# Patient Record
Sex: Female | Born: 1990 | Race: Black or African American | Hispanic: No | Smoking: Never smoker
Health system: Southern US, Community
[De-identification: ages and names within clinical notes are randomized; demographics above are authoritative.]

## PROBLEM LIST (undated history)

## (undated) DIAGNOSIS — J45909 Unspecified asthma, uncomplicated: Secondary | ICD-10-CM

## (undated) HISTORY — PX: TONSILLECTOMY: SUR1361

## (undated) HISTORY — PX: CYSTECTOMY: SUR359

---
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
## 2010-05-23 NOTE — Op Note (Signed)
NAME:  JURY, CASERTA NO.:  [id number]   MEDICAL RECORD NO.:  [id number]          PATIENT TYPE:  AMB   LOCATION:  DSC                          FACILITY:  [facility]   PHYSICIAN:  Bunnie Pion, MD   DATE OF BIRTH:  31-May-1990   DATE OF PROCEDURE:  03/15/2007  DATE OF DISCHARGE:  03/10/2007                               OPERATIVE REPORT   PREOPERATIVE DIAGNOSIS:  Pilonidal sinus.   POSTOPERATIVE DIAGNOSIS:  Pilonidal sinus.   OPERATION PERFORMED:  Incision drainage of pilonidal sinus.   SURGEON:  Cyd Silence, M.D.   DESCRIPTION OF PROCEDURE:  After identifying the patient, she was placed  in supine position upon the operating room table.  When adequate level  of anesthesia had been safely obtained, she was rolled to a prone  position and carefully padded and secured by the anesthesia team and  myself.  The buttocks were taped opened.  Examination of the gluteal  cleft superiorly did not demonstrate any evidence of an abscess.  The  site was prepped and draped.  A small punctum was examined and was  probed.  This entered into a small sinus cavity approximately the size  of a jelly bean.  There was no evidence of purulence within this.  A  small elliptical incision was made to open this up.  The cavity was  debrided with blunt instruments and electrocautery.  The site was packed  with iodoform gauze.  Dressings were applied.  The patient was awakened  in the operating room and returned to the recovery room in stable  condition.      Bunnie Pion, MD  Electronically Signed     TMW/MEDQ  D:  03/15/2007  T:  03/17/2007  Job:  [id number]

## 2010-10-02 LAB — POCT HEMOGLOBIN-HEMACUE: Hemoglobin: 11.7 — ABNORMAL LOW

## 2010-10-05 LAB — URINALYSIS, ROUTINE W REFLEX MICROSCOPIC
Bilirubin Urine: NEGATIVE
Glucose, UA: NEGATIVE
Ketones, ur: 15 — AB
Nitrite: NEGATIVE
Protein, ur: 30 — AB
Specific Gravity, Urine: 1.028
Urobilinogen, UA: 1
pH: 6

## 2010-10-05 LAB — CBC
HCT: 35.8 — ABNORMAL LOW
Hemoglobin: 11.6 — ABNORMAL LOW
MCHC: 32.5
MCV: 82
Platelets: 347
RBC: 4.36
RDW: 15.8 — ABNORMAL HIGH
WBC: 10.3

## 2010-10-05 LAB — URINE MICROSCOPIC-ADD ON

## 2010-10-05 LAB — URINE CULTURE: Colony Count: >=100000

## 2010-10-05 LAB — DIFFERENTIAL
Basophils Absolute: 0
Basophils Relative: 0
Eosinophils Absolute: 0.2
Eosinophils Relative: 2
Lymphocytes Relative: 19
Lymphs Abs: 1.9
Monocytes Absolute: 0.8
Monocytes Relative: 7
Neutro Abs: 7.4
Neutrophils Relative %: 72

## 2010-10-05 LAB — CULTURE, ROUTINE-ABSCESS: Gram Stain: NONE SEEN

## 2010-10-05 LAB — POCT PREGNANCY, URINE
Operator id: 277751
Preg Test, Ur: NEGATIVE

## 2010-10-16 LAB — URINALYSIS, ROUTINE W REFLEX MICROSCOPIC
Bilirubin Urine: NEGATIVE
Glucose, UA: NEGATIVE
Hgb urine dipstick: NEGATIVE
Ketones, ur: NEGATIVE
Nitrite: NEGATIVE
Protein, ur: NEGATIVE
Specific Gravity, Urine: 1.029
Urobilinogen, UA: 1
pH: 6.5

## 2010-10-16 LAB — COMPREHENSIVE METABOLIC PANEL
ALT: 25
AST: 55 — ABNORMAL HIGH
Albumin: 4
Alkaline Phosphatase: 44 — ABNORMAL LOW
BUN: 13
CO2: 24
Calcium: 8.8
Chloride: 106
Creatinine, Ser: 0.87
Glucose, Bld: 98
Potassium: 5.8 — ABNORMAL HIGH
Sodium: 138
Total Bilirubin: 1.7 — ABNORMAL HIGH
Total Protein: 7.2

## 2010-10-16 LAB — DIFFERENTIAL
Basophils Absolute: 0
Basophils Absolute: 0
Basophils Relative: 0
Basophils Relative: 0
Eosinophils Absolute: 0.1 — ABNORMAL LOW
Eosinophils Absolute: 0.1 — ABNORMAL LOW
Eosinophils Relative: 1
Eosinophils Relative: 1
Lymphocytes Relative: 13 — ABNORMAL LOW
Lymphocytes Relative: 8 — ABNORMAL LOW
Lymphs Abs: 0.7 — ABNORMAL LOW
Lymphs Abs: 0.9 — ABNORMAL LOW
Monocytes Absolute: 0.6
Monocytes Absolute: 0.7
Monocytes Relative: 11
Monocytes Relative: 7
Neutro Abs: 5.3
Neutro Abs: 7.5
Neutrophils Relative %: 75 — ABNORMAL HIGH
Neutrophils Relative %: 85 — ABNORMAL HIGH

## 2010-10-16 LAB — CBC
HCT: 36.7
HCT: 38.6
Hemoglobin: 11.9 — ABNORMAL LOW
Hemoglobin: 12.6
MCHC: 32.3
MCHC: 32.6
MCV: 83.1
MCV: 83.2
Platelets: 286
Platelets: 332
RBC: 4.41
RBC: 4.64
RDW: 14.7
RDW: 14.7
WBC: 7
WBC: 8.9

## 2010-10-16 LAB — URINE MICROSCOPIC-ADD ON

## 2010-10-16 LAB — I-STAT 8, (EC8 V) (CONVERTED LAB)
Acid-base deficit: 1
BUN: 6
Bicarbonate: 25.5 — ABNORMAL HIGH
Chloride: 102
Glucose, Bld: 104 — ABNORMAL HIGH
HCT: 41
Hemoglobin: 13.9
Operator id: 282201
Potassium: 3.2 — ABNORMAL LOW
Sodium: 136
TCO2: 27
pCO2, Ven: 49
pH, Ven: 7.324 — ABNORMAL HIGH

## 2010-10-16 LAB — URINE CULTURE: Colony Count: 60000

## 2010-10-16 LAB — POCT I-STAT CREATININE
Creatinine, Ser: 0.9
Operator id: 282201

## 2010-10-16 LAB — PREGNANCY, URINE: Preg Test, Ur: NEGATIVE

## 2014-10-01 ENCOUNTER — Emergency Department (HOSPITAL_COMMUNITY)
Admission: EM | Admit: 2014-10-01 | Discharge: 2014-10-01 | Disposition: A | Discharge status: Home / Self Care | Payer: Self-pay | Attending: Emergency Medicine | Admitting: Emergency Medicine

## 2014-10-01 ENCOUNTER — Encounter (HOSPITAL_COMMUNITY): Payer: Self-pay | Admitting: *Deleted

## 2014-10-01 DIAGNOSIS — J45909 Unspecified asthma, uncomplicated: Secondary | ICD-10-CM | POA: Insufficient documentation

## 2014-10-01 DIAGNOSIS — L02213 Cutaneous abscess of chest wall: Secondary | ICD-10-CM | POA: Insufficient documentation

## 2014-10-01 HISTORY — DX: Unspecified asthma, uncomplicated: J45.909

## 2014-10-01 MED ORDER — CLINDAMYCIN HCL 150 MG PO CAPS: 150.0000 mg | ORAL_CAPSULE | Freq: Four times a day (QID) | ORAL | Status: DC

## 2014-10-01 MED ORDER — CLINDAMYCIN HCL 150 MG PO CAPS
300.0000 mg | ORAL_CAPSULE | Freq: Once | ORAL | Status: AC
  Administered 2014-10-01: 300 mg via ORAL
  Filled 2014-10-01: qty 2

## 2014-10-01 MED ORDER — LIDOCAINE-EPINEPHRINE (PF) 2 %-1:200000 IJ SOLN
10.0000 mL | Freq: Once | INTRAMUSCULAR | Status: AC
  Administered 2014-10-01: 10 mL via INTRADERMAL
  Filled 2014-10-01: qty 20

## 2014-10-01 MED ORDER — HYDROCODONE-ACETAMINOPHEN 5-325 MG PO TABS
1.0000 | ORAL_TABLET | Freq: Once | ORAL | Status: AC
  Administered 2014-10-01: 1 via ORAL
  Filled 2014-10-01: qty 1

## 2014-10-01 MED ORDER — HYDROCODONE-ACETAMINOPHEN 5-325 MG PO TABS: 1.0000 | ORAL_TABLET | Freq: Four times a day (QID) | ORAL | Status: DC | PRN

## 2014-10-01 NOTE — ED Notes (Signed)
Pa Bowie at bedside. 

## 2014-10-01 NOTE — ED Notes (Addendum)
Pt reports a boil between her breasts. Pt noted to have a hardened area with tenderness. No drainage noted.

## 2014-10-01 NOTE — Discharge Instructions (Signed)
Please continue to apply warm compress over the affected area.  Take antibiotic as prescribed along with pain medication when needed.  Call and follow up with [facility] for further management if your condition worsen.  Otherwise, return to ER in two days for packing removal and wound recheck.  Abscess An abscess is an infected area that contains a collection of pus and debris.It can occur in almost any part of the body. An abscess is also known as a furuncle or boil. CAUSES  An abscess occurs when tissue gets infected. This can occur from blockage of oil or sweat glands, infection of hair follicles, or a minor injury to the skin. As the body tries to fight the infection, pus collects in the area and creates pressure under the skin. This pressure causes pain. People with weakened immune systems have difficulty fighting infections and get certain abscesses more often.  SYMPTOMS Usually an abscess develops on the skin and becomes a painful mass that is red, warm, and tender. If the abscess forms under the skin, you may feel a moveable soft area under the skin. Some abscesses break open (rupture) on their own, but most will continue to get worse without care. The infection can spread deeper into the body and eventually into the bloodstream, causing you to feel ill.  DIAGNOSIS  Your caregiver will take your medical history and perform a physical exam. A sample of fluid may also be taken from the abscess to determine what is causing your infection. TREATMENT  Your caregiver may prescribe antibiotic medicines to fight the infection. However, taking antibiotics alone usually does not cure an abscess. Your caregiver may need to make a small cut (incision) in the abscess to drain the pus. In some cases, gauze is packed into the abscess to reduce pain and to continue draining the area. HOME CARE INSTRUCTIONS   Only take over-the-counter or prescription medicines for pain, discomfort, or fever as  directed by your caregiver.  If you were prescribed antibiotics, take them as directed. Finish them even if you start to feel better.  If gauze is used, follow your caregiver's directions for changing the gauze.  To avoid spreading the infection:  Keep your draining abscess covered with a bandage.  Wash your hands well.  Do not share personal care items, towels, or whirlpools with others.  Avoid skin contact with others.  Keep your skin and clothes clean around the abscess.  Keep all follow-up appointments as directed by your caregiver. SEEK MEDICAL CARE IF:   You have increased pain, swelling, redness, fluid drainage, or bleeding.  You have muscle aches, chills, or a general ill feeling.  You have a fever. MAKE SURE YOU:   Understand these instructions.  Will watch your condition.  Will get help right away if you are not doing well or get worse. Document Released: 10/04/2004 Document Revised: 06/26/2011 Document Reviewed: 03/09/2011 [facility] Patient Information 2015 [place], [place]. This information is not intended to replace advice given to you by your health care provider. Make sure you discuss any questions you have with your health care provider.

## 2014-10-01 NOTE — ED Provider Notes (Signed)
CSN: [id number]     Arrival date & time 10/01/14  1749 History  This chart was scribed for Fayrene Helper, PA-C, working with Leta Baptist, MD by Elon Spanner, ED Scribe. This patient was seen in room TR09C/TR09C and the patient's care was started at 6:35 PM.   Chief Complaint  Patient presents with  . Recurrent Skin Infections   The history is provided by the patient. No language interpreter was used.   HPI Comments: Carol Haney is a 24 y.o. female with hx of abscess, pilonidal cyst who presents to the Emergency Department complaining of a moderately painful, gradually worsening knot between the breast cleft onset 5 days ago, not relieved with ibuprofen.  The pain is worse with any touch/pressure and improved with warm compresses.  She reports this episode is different from her typical abscess because "it has not come to a head yet."  She also notes She denies fever, SOB, injury, itching.  Tetanus status unknown.     Past Medical History  Diagnosis Date  . Asthma    History reviewed. No pertinent past surgical history. No family history on file. Social History  Substance Use Topics  . Smoking status: Never Smoker   . Smokeless tobacco: None  . Alcohol Use: None   OB History    No data available     Review of Systems  Constitutional: Negative for fever.  Respiratory: Negative for cough and shortness of breath.       Allergies  Review of patient's allergies indicates no known allergies.  Home Medications   Prior to Admission medications   Not on File   BP 108/78 mmHg  Pulse 68  Temp(Src) 98.2 F (36.8 C) (Oral)  Resp 22  SpO2 99%  LMP 09/10/2014 Physical Exam  Constitutional: She is oriented to person, place, and time. She appears well-developed and well-nourished. No distress.  HENT:  Head: Normocephalic and atraumatic.  Eyes: Conjunctivae and EOM are normal.  Neck: Neck supple. No tracheal deviation present.  Cardiovascular: Normal rate.    Pulmonary/Chest: Effort normal. No respiratory distress. She has no wheezes. She has no rales. She exhibits tenderness.  Abdominal: Soft. There is no tenderness.  Musculoskeletal: Normal range of motion.  Neurological: She is alert and oriented to person, place, and time.  Skin: Skin is warm and dry.  Area of induration noted to the mid-sternal region between the cleft of the breasts approximately 4-5cm in diameter and TTP without any surrounding erythema.  Area is mobile, not fixed to the sternum.    Psychiatric: She has a normal mood and affect. Her behavior is normal.  Nursing note and vitals reviewed.   ED Course  Procedures (including critical care time)  DIAGNOSTIC STUDIES: Oxygen Saturation is 99% on RA, normal by my interpretation.    COORDINATION OF CARE:  6:46 PM Patient advised of plans to perform UTS study.  Patient acknowledges and agrees with plan.    7:10 PM Bedside US performed by me showing hypoechogenic area superficial to sternum, likely reflecting a subcutaneous abscess.  I discussed care with Dr. Cyndie Chime who recommend consulting [facility] for further treatment.    7:52 PM Pt given clindamycin and pain medication while in ER.    8:48 PM Appreciate consultation from [facility] Dr. Donell Beers who recommend performing I&D in ER. I agree to perform the procedure.  Pt agrees with plan.  INCISION AND DRAINAGE Performed by: Fayrene Helper Consent: Verbal consent obtained. Risks and benefits: risks, benefits and alternatives were discussed  Type: abscess  Body area: midsternal, cutaneous  Anesthesia: local infiltration  Incision was made with a scalpel.  Local anesthetic: lidocaine 2% w epinephrine  Anesthetic total: 8 ml  Complexity: complex Blunt dissection to break up loculations  Drainage: purulent  Drainage amount: moderate  Packing material: 1/4 in iodoform gauze  Patient tolerance: Patient tolerated the procedure well with no immediate complications.    MDM    Final diagnoses:  Cutaneous abscess of chest wall    BP 128/76 mmHg  Pulse 76  Temp(Src) 98 F (36.7 C) (Oral)  Resp 16  SpO2 100%  LMP 09/10/2014   I personally performed the services described in this documentation, which was scribed in my presence. The recorded information has been reviewed and is accurate.     Fayrene Helper, PA-C 10/01/14 2049  Leta Baptist, MD 10/02/14 [phone]

## 2014-10-03 ENCOUNTER — Encounter (HOSPITAL_COMMUNITY): Payer: Self-pay | Admitting: Nurse Practitioner

## 2014-10-03 ENCOUNTER — Emergency Department (HOSPITAL_COMMUNITY)
Admission: EM | Admit: 2014-10-03 | Discharge: 2014-10-03 | Disposition: A | Discharge status: Home / Self Care | Payer: Self-pay | Attending: Emergency Medicine | Admitting: Emergency Medicine

## 2014-10-03 DIAGNOSIS — J45909 Unspecified asthma, uncomplicated: Secondary | ICD-10-CM | POA: Insufficient documentation

## 2014-10-03 DIAGNOSIS — L02213 Cutaneous abscess of chest wall: Secondary | ICD-10-CM | POA: Insufficient documentation

## 2014-10-03 NOTE — Discharge Instructions (Signed)

## 2014-10-03 NOTE — ED Provider Notes (Signed)
CSN: [id number]     Arrival date & time 10/03/14  1404 History   First MD Initiated Contact with Patient 10/03/14 1430     Chief Complaint  Patient presents with  . Wound Check     (Consider location/radiation/quality/duration/timing/severity/associated sxs/prior Treatment) HPI  Patient developed a chest wall abscess that was incised and drained 2 days ago. She was discharged with antibiotic and pain medication. She is here for her 2 days wound recheck. Patient states that she has been taking antibiotic.  Past Medical History  Diagnosis Date  . Asthma    History reviewed. No pertinent past surgical history. History reviewed. No pertinent family history. Social History  Substance Use Topics  . Smoking status: Never Smoker   . Smokeless tobacco: None  . Alcohol Use: No   OB History    No data available     Review of Systems  Constitutional: Negative for fever.  Skin: Positive for rash and wound.  Neurological: Negative for numbness.      Allergies  Review of patient's allergies indicates no known allergies.  Home Medications   Prior to Admission medications   Medication Sig Start Date End Date Taking? Authorizing Provider  clindamycin (CLEOCIN) 150 MG capsule Take 1 capsule (150 mg total) by mouth every 6 (six) hours. 10/01/14   Fayrene Helper, PA-C  HYDROcodone-acetaminophen (NORCO/VICODIN) 5-325 MG per tablet Take 1 tablet by mouth every 6 (six) hours as needed for moderate pain or severe pain. 10/01/14   Fayrene Helper, PA-C   BP 122/75 mmHg  Pulse 82  Temp(Src) 98.5 F (36.9 C) (Oral)  Resp 16  Ht  (1.626 m)  Wt 225 lb 1.6 oz (102.105 kg)  BMI 38.62 kg/m2  SpO2 99%  LMP 09/10/2014 Physical Exam  Constitutional: She appears well-developed and well-nourished. No distress.  HENT:  Head: Atraumatic.  Eyes: Conjunctivae are normal.  Neck: Neck supple.  Pulmonary/Chest: She exhibits tenderness (anterior chest at the cleft of bilateral breast is an area of  induration, with prior I&D incision and packing is in place.  mild tenderness to palpation, small amount of pustule discharge is expressed.  no rash.).  Abdominal: Soft. There is no tenderness.  Neurological: She is alert.  Skin: No rash noted.  Psychiatric: She has a normal mood and affect.  Nursing note and vitals reviewed.   ED Course  Procedures (including critical care time)  Pt had chest wall abscess I&D 2 days ago.  She is having improvement of infection but likely will last for a few more days before packing can be removed.  encourage continue with abx, warm compress and to f/u with urgent care in 2 days for wound recheck and packing removal.  Pt agrees with plan.  No systemic sxs.     MDM   Final diagnoses:  Cutaneous abscess of chest wall    BP 122/75 mmHg  Pulse 82  Temp(Src) 98.5 F (36.9 C) (Oral)  Resp 16  Ht  (1.626 m)  Wt 225 lb 1.6 oz (102.105 kg)  BMI 38.62 kg/m2  SpO2 99%  LMP 09/10/2014     Fayrene Helper, PA-C 10/03/14 1443  Alvira Monday, MD 10/05/14 1119

## 2014-10-03 NOTE — ED Notes (Signed)
She was here 9/23 for I&D of abscess to chest with oral abx. She was told to return today for wound recheck. Repots she did complete abx. She has noticed pus and blood oozing around the packing

## 2015-01-12 ENCOUNTER — Ambulatory Visit: Payer: 59

## 2015-01-12 ENCOUNTER — Encounter (HOSPITAL_COMMUNITY): Payer: Self-pay

## 2015-01-12 ENCOUNTER — Emergency Department (HOSPITAL_COMMUNITY)
Admission: EM | Admit: 2015-01-12 | Discharge: 2015-01-12 | Disposition: A | Discharge status: Home / Self Care | Payer: 59 | Attending: Physician Assistant | Admitting: Physician Assistant

## 2015-01-12 ENCOUNTER — Emergency Department (HOSPITAL_COMMUNITY): Payer: 59

## 2015-01-12 DIAGNOSIS — R112 Nausea with vomiting, unspecified: Secondary | ICD-10-CM | POA: Insufficient documentation

## 2015-01-12 DIAGNOSIS — J45909 Unspecified asthma, uncomplicated: Secondary | ICD-10-CM | POA: Diagnosis not present

## 2015-01-12 DIAGNOSIS — R0789 Other chest pain: Secondary | ICD-10-CM | POA: Diagnosis not present

## 2015-01-12 DIAGNOSIS — Z792 Long term (current) use of antibiotics: Secondary | ICD-10-CM | POA: Diagnosis not present

## 2015-01-12 DIAGNOSIS — R6883 Chills (without fever): Secondary | ICD-10-CM | POA: Diagnosis not present

## 2015-01-12 DIAGNOSIS — R079 Chest pain, unspecified: Secondary | ICD-10-CM | POA: Diagnosis present

## 2015-01-12 DIAGNOSIS — R51 Headache: Secondary | ICD-10-CM | POA: Diagnosis not present

## 2015-01-12 LAB — COMPREHENSIVE METABOLIC PANEL
ALBUMIN: 3.3 g/dL — AB (ref 3.5–5.0)
ALT: 19 U/L (ref 14–54)
ANION GAP: 7 (ref 5–15)
AST: 18 U/L (ref 15–41)
Alkaline Phosphatase: 47 U/L (ref 38–126)
BILIRUBIN TOTAL: 0.3 mg/dL (ref 0.3–1.2)
BUN: 6 mg/dL (ref 6–20)
CO2: 28 mmol/L (ref 22–32)
Calcium: 8.8 mg/dL — ABNORMAL LOW (ref 8.9–10.3)
Chloride: 105 mmol/L (ref 101–111)
Creatinine, Ser: 0.63 mg/dL (ref 0.44–1.00)
GFR calc non Af Amer: >60 mL/min (ref >60)
GLUCOSE: 94 mg/dL (ref 65–99)
POTASSIUM: 3.5 mmol/L (ref 3.5–5.1)
Sodium: 140 mmol/L (ref 135–145)
TOTAL PROTEIN: 6.9 g/dL (ref 6.5–8.1)

## 2015-01-12 LAB — I-STAT TROPONIN, ED: TROPONIN I, POC: 0 ng/mL (ref 0.00–0.08)

## 2015-01-12 LAB — CBC WITH DIFFERENTIAL/PLATELET
BASOS PCT: 0 %
Basophils Absolute: 0 10*3/uL (ref 0.0–0.1)
EOS ABS: 0.1 10*3/uL (ref 0.0–0.7)
EOS PCT: 3 %
HEMATOCRIT: 36.9 % (ref 36.0–46.0)
Hemoglobin: 11.7 g/dL — ABNORMAL LOW (ref 12.0–15.0)
LYMPHS ABS: 1.2 10*3/uL (ref 0.7–4.0)
LYMPHS PCT: 25 %
MCH: 26.5 pg (ref 26.0–34.0)
MCHC: 31.7 g/dL (ref 30.0–36.0)
MCV: 83.7 fL (ref 78.0–100.0)
MONO ABS: 0.6 10*3/uL (ref 0.1–1.0)
Monocytes Relative: 13 %
NEUTROS ABS: 2.8 10*3/uL (ref 1.7–7.7)
Neutrophils Relative %: 59 %
Platelets: 256 10*3/uL (ref 150–400)
RBC: 4.41 MIL/uL (ref 3.87–5.11)
RDW: 14.2 % (ref 11.5–15.5)
WBC: 4.7 10*3/uL (ref 4.0–10.5)

## 2015-01-12 LAB — LIPASE, BLOOD: LIPASE: 20 U/L (ref 11–51)

## 2015-01-12 MED ORDER — ONDANSETRON HCL 4 MG PO TABS: 4.0000 mg | ORAL_TABLET | Freq: Three times a day (TID) | ORAL | Status: DC | PRN

## 2015-01-12 MED ORDER — SODIUM CHLORIDE 0.9 % IV BOLUS (SEPSIS)
1000.0000 mL | Freq: Once | INTRAVENOUS | Status: AC
  Administered 2015-01-12: 1000 mL via INTRAVENOUS

## 2015-01-12 NOTE — ED Notes (Signed)
Pt states no vomiting since yesterday at 0500.

## 2015-01-12 NOTE — ED Notes (Signed)
Pt here with c/o N/V body aches and chest pain onset Monday after trying to eat bacon and orange juice. She states she hasnt been able to hold anything down since . She also reports left side chest pain later Monday night after vomiting all day.

## 2015-01-12 NOTE — ED Provider Notes (Signed)
CSN: [id number]     Arrival date & time 01/12/15  1006 History   First MD Initiated Contact with Patient 01/12/15 1019     Chief Complaint  Patient presents with  . Emesis  . Chest Pain     (Consider location/radiation/quality/duration/timing/severity/associated sxs/prior Treatment) HPI   25 year old female with history of asthma presenting for evaluation of chest discomfort. Patient reports for the past 3 days she has had headache, chills, body aches, follows with nausea and vomiting. States she vomited multiple bouts yesterday. Vomitus is nonbloody nonbilious. She endorse mild loose stools After vomiting she noticed midsternal chest discomfort which she described as a burning achy sensation with associated shortness of breath. Her nausea vomiting has since resolved but she did report having similar chest discomfort this morning. She called her doctor's office and was recommended to come to the ED for further evaluation. Patient currently denies having any fever, chills, headache, lightheadedness, dizziness, active chest pain, difficulty breathing, abdominal pain, back pain, productive cough, hemoptysis, exertional chest discomfort, or rash. She reported recent exposure to someone else with stomach virus. She is nonsmoker without any significant cardiac history or any family history of premature cardiac death. She denies any prior history of PE or DVT, no recent surgery, prolonged bed rest, unilateral leg swelling or calf pain, active cancer, or taking hormone.  Past Medical History  Diagnosis Date  . Asthma    History reviewed. No pertinent past surgical history. No family history on file. Social History  Substance Use Topics  . Smoking status: Never Smoker   . Smokeless tobacco: None  . Alcohol Use: No   OB History    No data available     Review of Systems  All other systems reviewed and are negative.     Allergies  Review of patient's allergies indicates no known  allergies.  Home Medications   Prior to Admission medications   Medication Sig Start Date End Date Taking? Authorizing Provider  clindamycin (CLEOCIN) 150 MG capsule Take 1 capsule (150 mg total) by mouth every 6 (six) hours. 10/01/14   Fayrene HelperBowie Enaya Howze, PA-C  HYDROcodone-acetaminophen (NORCO/VICODIN) 5-325 MG per tablet Take 1 tablet by mouth every 6 (six) hours as needed for moderate pain or severe pain. 10/01/14   Fayrene HelperBowie Graviela Nodal, PA-C   BP 107/76 mmHg  Pulse 81  Temp(Src) 98.5 F (36.9 C) (Oral)  Resp 26  SpO2 100%  LMP 01/12/2015 Physical Exam  Constitutional: She is oriented to person, place, and time. She appears well-developed and well-nourished. No distress.  HENT:  Head: Atraumatic.  Eyes: Conjunctivae are normal.  Neck: Neck supple. No JVD present.  Cardiovascular: Normal rate, regular rhythm and intact distal pulses.   Pulmonary/Chest: Effort normal and breath sounds normal. No respiratory distress. She exhibits no tenderness.  Abdominal: Soft. Bowel sounds are normal. She exhibits no distension. There is no tenderness.  Musculoskeletal: She exhibits no edema.  Neurological: She is alert and oriented to person, place, and time.  Skin: No rash noted.  Psychiatric: She has a normal mood and affect.  Nursing note and vitals reviewed.   ED Course  Procedures (including critical care time) Labs Review Labs Reviewed  CBC WITH DIFFERENTIAL/PLATELET - Abnormal; Notable for the following:    Hemoglobin 11.7 (*)    All other components within normal limits  COMPREHENSIVE METABOLIC PANEL - Abnormal; Notable for the following:    Calcium 8.8 (*)    Albumin 3.3 (*)    All other components within normal limits  LIPASE, BLOOD  I-STAT TROPOININ, ED    Imaging Review Dg Chest 2 View  01/12/2015  CLINICAL DATA:  Cold and chills.  Cough for 2 days. EXAM: CHEST  2 VIEW COMPARISON:  12/25/2006 FINDINGS: Vascular structure or minimal scarring medially at the right lung base, no change from  12/19/2006. The lungs appear otherwise clear. Mild enlargement of the cardiopericardial silhouette with cardiothoracic index 53%. No edema. No pleural effusion. IMPRESSION: 1. Mild enlargement of the cardiopericardial silhouette, without edema. Electronically Signed   By: Gaylyn Rong M.D.   On: 01/12/2015 12:20   I have personally reviewed and evaluated these images and lab results as part of my medical decision-making.   EKG Interpretation   Date/Time:  Wednesday January 12 2015 10:23:33 EST Ventricular Rate:  77 PR Interval:  135 QRS Duration: 88 QT Interval:  373 QTC Calculation: 422 R Axis:   76 Text Interpretation:  Sinus rhythm Borderline Q waves in inferior leads no  acute ischemia No significant change since last tracing Confirmed by  Kandis Mannan ([id number]) on 01/12/2015 10:27:34 AM      MDM   Final diagnoses:  Non-intractable vomiting with nausea, vomiting of unspecified type  Atypical chest pain    BP 118/81 mmHg  Pulse 77  Temp(Src) 98.5 F (36.9 C) (Oral)  Resp 25  SpO2 99%  LMP 01/12/2015   11:17 AM Patient presents for chest discomfort after persistent vomiting likely from a viral GI. She does not have any significant cardiac history and is PERC negative, therefore low suspicion for PE. No significant abdominal discomfort on exam to suggest biliary disease. Workup initiated.  1:11 PM EKG, troponin, and chest x-ray of unremarkable. Patient's basic labs and electrolytes panels are reassuring. Patient has not vomited while in the ED and resting comfortably. At this time no acute emergent medical condition identified. Patient is stable to discharge with anti-emetic medication. Return precaution discussed. A list of resources was given.    Fayrene Helper, PA-C 01/12/15 1318  Courteney Lyn Corlis Leak, MD 01/12/15 1652

## 2015-01-12 NOTE — Discharge Instructions (Signed)
°Emergency Department Resource Guide °1) Find a Doctor and Pay Out of Pocket °Although you won't have to find out who is covered by your insurance plan, it is a good idea to ask around and get recommendations. You will then need to call the office and see if the doctor you have chosen will accept you as a new patient and what types of options they offer for patients who are self-pay. Some doctors offer discounts or will set up payment plans for their patients who do not have insurance, but you will need to ask so you aren't surprised when you get to your appointment. ° °2) Contact Your Local Health Department °Not all health departments have doctors that can see patients for sick visits, but many do, so it is worth a call to see if yours does. If you don't know where your local health department is, you can check in your phone book. The CDC also has a tool to help you locate your state's health department, and many state websites also have listings of all of their local health departments. ° °3) Find a Walk-in Clinic °If your illness is not likely to be very severe or complicated, you may want to try a walk in clinic. These are popping up all over the country in pharmacies, drugstores, and shopping centers. They're usually staffed by nurse practitioners or physician assistants that have been trained to treat common illnesses and complaints. They're usually fairly quick and inexpensive. However, if you have serious medical issues or chronic medical problems, these are probably not your best option. ° °No Primary Care Doctor: °- Call [facility] at  832-8000 - they can help you locate a primary care doctor that  accepts your insurance, provides certain services, etc. °- Physician Referral Service- 1-800-533-3463 ° °Chronic Pain Problems: °Organization         Address  Phone   Notes  °[facility]  (336) 297-2271 Patients need to be referred by their primary care doctor.  ° °Medication  Assistance: °Organization         Address  Phone   Notes  °[place] [facility] [address] °[place], [place] [address] (336) 641-8030 --Must be a resident of [place] °-- Must have NO insurance coverage whatsoever (no Medicaid/ Medicare, etc.) °-- The pt. MUST have a primary care doctor that directs their care regularly and follows them in the community °  °[facility]  (866) 331-1348   °[employer]  (888) 892-1162   ° °Agencies that provide inexpensive medical care: °Organization         Address  Phone   Notes  °[facility]  (336) 832-8035   °[facility]    (336) 832-7272   °[facility] [address] °[place], [place] [address] (336) 832-4777   °[facility] [address], °[place] (336) 271-4999   °[facility]    (336) 373-0678   °[facility]    (336) 272-1050   °[facility] ° [address], [place] Phone:  (336) 832-4444, Fax:  (336) 832-4440 Hours of Operation:  9 am - 6 pm, M-F.  Also accepts Medicaid/Medicare and self-pay.  °[facility] ° [address],  Phone: (336) 832-3150, Fax: (336) 832-3151. Hours of Operation:  8:30 am - 5:30 pm, M-F.  Also accepts Medicaid and self-pay.  °[facility] [place] [address], [place] Phone: (336) 878-6027   °[facility] [address], [place], [place] (336)723-1848, Ext. 123 Mondays & Thursdays: 7-9 AM.  First 15 patients are seen on a first come, first serve basis. °  ° °Medicaid-accepting [place] Providers: ° °Organization         Address  Phone   Notes  °[facility] [address], [place] (336) 641-2100 Also accepts self-pay patients.  °[facility] [address], [place] ° (336) 856-9996   °[facility] [address], [place]  (336) 288-8857   °[facility] [address] [place] [address], [place] (336) 299-7000   °Veita Bland [address], [place]  ° (336) 373-1557 Only accepts [place] Access Medicaid patients after they have their name applied to their card.  ° °Self-Pay (no insurance) in [place]: ° °Organization         Address  Phone   Notes  °Sickle Cell Patients, [facility] [address], [place] (336) 832-1970   °[facility] [address], [place] (336) 832-4400   °[facility] ° [address], [place] (336) 992-4800   °[facility]/Dr. Osei-Bonsu ° [address] [place] [address], [place] or [address], [place] (336) 841-8500 Phone number for both [place] and [place] locations is the same.  °[facility] [address], [place] (336) 299-0000   °[facility] [address] [place] [address], [place] or [address] (336) 852-7530 °(336) 878-2260   °[facility] [address], [place] (336) 350-1642, phone; (336) 294-5005, fax Sees patients 1st and 3rd Saturday of every month.  Must not qualify for public or private insurance (i.e. Medicaid, Medicare, [place] Health Choice, Veterans' Benefits) • Household income should be no more than 200% of the poverty level •The clinic cannot treat you if you are pregnant or think you are pregnant • Sexually transmitted diseases are not treated at the clinic.  ° ° °Dental Care: °Organization         Address  Phone  Notes  °[place] [facility] [address], [place] (336) 641-6152 Accepts children up to age 21 who are enrolled in Medicaid or [place] Health Choice; pregnant women with a Medicaid card; and children who have applied for Medicaid or [place] Health Choice, but were declined, whose parents can pay a reduced fee at time of service.  °[place]  [facility] [place]  [address], [place] (336) 641-7733 Accepts children up to age 21 who are enrolled in Medicaid or [place] Health Choice; pregnant women with a Medicaid card; and children who have applied for Medicaid or [place] Health Choice, but were declined, whose parents can pay a reduced fee at time of service.  °[facility] ° [address], [place] (336) 641-4533 Patients are seen by appointment only. Walk-ins are not accepted. [facility] will see patients 18 years of age and older. °Monday - Tuesday (8am-5pm) °Most Wednesdays (8:30-5pm) °$30 per visit, cash only  °[facility] ° [address], [place] (336) 641-4533 Patients are seen by appointment only. Walk-ins are not accepted. [facility] will see patients 18 years of age and older. °One   Wednesday Evening (Monthly: Volunteer Based).  $30 per visit, cash only  °[facility]  (919) 537-3737 for adults; Children under age 4, call Graduate Pediatric Dentistry at (919) [phone]. Children aged 4-14, please call (919) [phone] to request a pediatric application. ° Dental services are provided in all areas of dental care including fillings, crowns and bridges, complete and partial dentures, implants, gum treatment, root canals, and extractions. Preventive care is also provided. Treatment is provided to both adults and children. °Patients are selected via a lottery and there is often a waiting list. °  °[facility] [address], °[place] ° (336) 763-8833 [email or web address].drcivils.[email or web address] °  °[facility] [address], [place], [place] (336)723-1848, Ext. 123 Second and Fourth Thursday of each month, opens at 6:30 AM; Clinic ends at 9 AM.  Patients are seen on a first-come first-served basis, and a limited number are seen during each clinic.  ° °[facility] ° [address], [place], [place] (336) 723-7904    Eligibility Requirements °You must have lived in [place], [place], or [place] counties for at least the last three months. °  You cannot be eligible for state or federal sponsored healthcare insurance, including Veterans Administration, Medicaid, or Medicare. °  You generally cannot be eligible for healthcare insurance through your employer.  °  How to apply: °Eligibility screenings are held every Tuesday and Wednesday afternoon from 1:00 pm until 4:00 pm. You do not need an appointment for the interview!  °[facility] [address], [place], [place] 336-631-2330   °[facility]  336-342-8273   °[facility]  336-703-3100   °[place]  336-570-6415   ° °Behavioral Health Resources in the Community: °Intensive Outpatient Programs °Organization         Address  Phone  Notes  °[facility] [address], [place], [place] 336-878-6098   °[facility] [address], [place], [place] 336-832-9800   °[facility] [address], [place], [place] ° 336-882-2125   °[facility] [address],  °[place], [place] 1-800-853-5163 or 336-641-4981   °Substance Abuse Resources °Organization         Address  Phone  Notes  °[facility]  336-882-2125   °[facility]  336-784-9470   °[facility]  336-285-9073   °[facility]  336-845-3988   °Residential & Outpatient Substance Abuse Program  1-800-659-3381   °Psychological Services °Organization         Address  Phone  Notes  °[facility]  336- 832-9600   °[facility]  336- 378-7881   °[facility] [address], [place] 1-800-853-5163 or 336-641-4981   ° °Mobile Crisis Teams °Organization         Address  Phone  Notes  °[facility], Mobile Crisis Care Unit  1-877-626-1772   °Assertive °[facility] ° [address]  [place], [place] 336-834-9664   °Sharon DeEsch [address] °[place] 336-554-5454   ° °Self-Help/Support Groups °Organization         Address  Phone             Notes  °[facility] - variety of support groups  336- 373-1402 Call for more information  °Narcotics Anonymous (NA), [facility] [address], °[place]  2 meetings at this location  ° °  Residential Treatment Programs °Organization         Address  Phone  Notes  °[facility] [address],    °[place]  1-866-801-8205   °[facility] ° [address], [place], [place] 704-293-8524   °[facility] [address], [place] 336-845-3988 Admissions: 8am-3pm M-F  °[facility] [address]    °[place], [place] 336-841-1104   °[facility] [address], [place], [place] 336-379-7146   °[facility] [address]  °[place], [place] 336-285-9073   °[facility] Intensive Outpatient [address], [place], [place] 336-852-3033   °[facility] [address]  °[place], [place] 1-877-615-2722 or 336-784-9470   °[facility] ([facility] [address] [place], [place] 336-227-7417 Accepts Medicaid  °[facility] [address]  °[place] 1-800-659-3381 Substance Abuse/Addiction Treatment  ° °[place] Behavioral Health Resources °Organization         Address  Phone  Notes  °[facility]  (888) 581-9988   °Julie Brannon, PhD [address] [place], [place]   (336) 349-5553 or (336) 951-0000   °[facility]   [address] °[place], [place] (336) 349-4454   °[facility] [address], [place], [place] (336) 342-8316 Insurance/Medicaid/sponsorship through [facility]  °[facility] [address]                                    [place], [place] (336) 342-8316 Therapy/tele-psych/case    °[facility] [address]  ° [place], [place] (336) 349-2233    °Dr. Arfeen  (336) 349-4544   °[facility] [place]  [employer] [place] [facility] 1) [address], [place] °2) [address], [place] °3)  [address], [place] (336) 349-3220 °(336) 342-7768 ° °(336) 342-8140   °[place] Child Abuse [facility] (336) 342-1394 or (336) 342-3537 (After Hours)    ° ° °

## 2015-06-15 ENCOUNTER — Encounter (HOSPITAL_COMMUNITY): Payer: Self-pay

## 2015-06-15 ENCOUNTER — Inpatient Hospital Stay (HOSPITAL_COMMUNITY)
Admission: AD | Admit: 2015-06-15 | Discharge: 2015-06-16 | Disposition: A | Discharge status: Home / Self Care | Payer: 59 | Source: Ambulatory Visit | Attending: Obstetrics and Gynecology | Admitting: Obstetrics and Gynecology

## 2015-06-15 DIAGNOSIS — N611 Abscess of the breast and nipple: Secondary | ICD-10-CM | POA: Insufficient documentation

## 2015-06-15 DIAGNOSIS — R21 Rash and other nonspecific skin eruption: Secondary | ICD-10-CM | POA: Insufficient documentation

## 2015-06-15 NOTE — MAU Note (Signed)
Pt reports she started out with what she thought was a boil on her right breast but now it is getting much bigger, more painful and hot to the touch

## 2015-06-15 NOTE — MAU Provider Note (Signed)
History     CSN: [id number]  Arrival date and time: 06/15/15 2309   First Provider Initiated Contact with Patient 06/15/15 2337      Chief Complaint  Patient presents with  . Recurrent Skin Infections   HPI Comments: Lorene DyLaquita D Koble is a 25 y.o. Who presents today with a boil on her right breast. She states that she gets frequent boils, and she does not think this one is ready to be drained. However, she is having a lot of pain at this time, and that is her primary reason for coming to be seen. She rates her pain 10/10 at this time.   Rash This is a new problem. The current episode started in the past 7 days. The problem has been gradually worsening since onset. Location: right breast. The rash is characterized by pain, redness and swelling. She was exposed to nothing. Pertinent negatives include no fever or vomiting. Past treatments include analgesics (boil-ease). The treatment provided no relief. (History of recurrent boils )    Past Medical History  Diagnosis Date  . Asthma     Past Surgical History  Procedure Laterality Date  . Cystectomy    . Tonsillectomy      History reviewed. No pertinent family history.  Social History  Substance Use Topics  . Smoking status: Never Smoker   . Smokeless tobacco: None  . Alcohol Use: No    Allergies: No Known Allergies  Prescriptions prior to admission  Medication Sig Dispense Refill Last Dose  . ibuprofen (ADVIL,MOTRIN) 200 MG tablet Take 600 mg by mouth every 6 (six) hours as needed (pain).   Past Week at Unknown time  . ondansetron (ZOFRAN) 4 MG tablet Take 1 tablet (4 mg total) by mouth every 8 (eight) hours as needed for nausea or vomiting. 12 tablet 0   . Pseudoephedrine-APAP-DM (TYLENOL COLD/FLU SEVERE DAY PO) Take 1 tablet by mouth 2 (two) times daily as needed (cold/flu).   01/11/2015 at Unknown time    Review of Systems  Constitutional: Negative for fever and chills.  Gastrointestinal: Negative for nausea and  vomiting.  Skin: Positive for rash.   Physical Exam   Blood pressure 129/74, pulse 81, temperature 98.8 F (37.1 C), temperature source Oral, resp. rate 18, height 5' 5.5" (1.664 m), weight 107.956 kg (238 lb), last menstrual period 06/12/2015, SpO2 99 %.  Physical Exam  Nursing note and vitals reviewed. Constitutional: She is oriented to person, place, and time. She appears well-developed and well-nourished. No distress.  HENT:  Head: Normocephalic.  Cardiovascular: Normal rate.   Respiratory: Effort normal. Right breast exhibits mass (large non fluctuant boil with associated reddened area around it. Area is not ready for I&D at this time. ), skin change and tenderness. Right breast exhibits no inverted nipple. Left breast exhibits no inverted nipple, no mass, no nipple discharge, no skin change and no tenderness.    GI: Soft. There is no tenderness.  Neurological: She is alert and oriented to person, place, and time.  Skin: Skin is warm and dry.   MAU Course  Procedures  MDM Patient given toradol and vicodin for pain here.  See photos uploaded for size comparison at next visit.   Patient's mother verbalized dissatisfaction with management of the patient's pain. Informed mother that we need time to assess the effectiveness of the pain medication. Mother became upset, and states that she is leaving with her daughter. [facility]. Patient is ok  with current plan to treat pain, give antibiotics, and FU in the clinic.   Assessment and Plan   1. Abscess of breast    DC home Comfort measures reviewed  RX: bactrim BID x 7 days, vicodin PRN for pain #20  Return to MAU as needed   Follow-up Information    [facility] with [facility].   Specialty:  Obstetrics and Gynecology   Why:  Monday 06/20/15 at 3:00 pm    Contact information:   [address] [place] [address] [phone]        Tawnya Crook 06/15/2015, 11:39 PM

## 2015-06-16 DIAGNOSIS — N611 Abscess of the breast and nipple: Secondary | ICD-10-CM

## 2015-06-16 DIAGNOSIS — R21 Rash and other nonspecific skin eruption: Secondary | ICD-10-CM | POA: Diagnosis not present

## 2015-06-16 MED ORDER — HYDROCODONE-ACETAMINOPHEN 5-325 MG PO TABS: 1.0000 | ORAL_TABLET | ORAL | Status: DC | PRN

## 2015-06-16 MED ORDER — SULFAMETHOXAZOLE-TRIMETHOPRIM 800-160 MG PO TABS
1.0000 | ORAL_TABLET | Freq: Two times a day (BID) | ORAL | Status: DC
Start: 2015-06-16 — End: 2016-04-15

## 2015-06-16 MED ORDER — HYDROCODONE-ACETAMINOPHEN 5-325 MG PO TABS
2.0000 | ORAL_TABLET | Freq: Once | ORAL | Status: AC
  Administered 2015-06-16: 2 via ORAL
  Filled 2015-06-16: qty 2

## 2015-06-16 MED ORDER — KETOROLAC TROMETHAMINE 60 MG/2ML IM SOLN
60.0000 mg | Freq: Once | INTRAMUSCULAR | Status: AC
  Administered 2015-06-16: 60 mg via INTRAMUSCULAR
  Filled 2015-06-16: qty 2

## 2015-06-16 MED ORDER — SULFAMETHOXAZOLE-TRIMETHOPRIM 800-160 MG PO TABS
1.0000 | ORAL_TABLET | Freq: Once | ORAL | Status: DC
  Filled 2015-06-16: qty 1

## 2015-06-16 NOTE — MAU Note (Signed)
Went into patient's room to administer vicodin PO, bactrum PO and toradol IM but patient did not want to stay 20 minutes after to assess for reaction. Thressa ShellerHeather Hogan CNM told patient it was okay to leave right after receiving vicodin and toradol but to not give the bactrum. I administered medications and patient left right after.

## 2015-06-16 NOTE — Discharge Instructions (Signed)

## 2015-06-20 ENCOUNTER — Ambulatory Visit: Payer: 59 | Admitting: Family Medicine

## 2015-06-20 ENCOUNTER — Telehealth: Payer: Self-pay

## 2015-06-20 NOTE — Telephone Encounter (Signed)
Pt miss her follow up appointment for a breast abscess. I reach out to patient and she does not wish to scheduled appointment at this time.

## 2015-09-10 ENCOUNTER — Encounter (HOSPITAL_COMMUNITY): Payer: Self-pay | Admitting: Emergency Medicine

## 2015-09-10 ENCOUNTER — Emergency Department (HOSPITAL_COMMUNITY)
Admission: EM | Admit: 2015-09-10 | Discharge: 2015-09-10 | Disposition: A | Discharge status: Home / Self Care | Payer: 59 | Attending: Dermatology | Admitting: Dermatology

## 2015-09-10 DIAGNOSIS — Z5321 Procedure and treatment not carried out due to patient leaving prior to being seen by health care provider: Secondary | ICD-10-CM | POA: Insufficient documentation

## 2015-09-10 DIAGNOSIS — L02411 Cutaneous abscess of right axilla: Secondary | ICD-10-CM | POA: Diagnosis present

## 2015-09-10 DIAGNOSIS — J45909 Unspecified asthma, uncomplicated: Secondary | ICD-10-CM | POA: Insufficient documentation

## 2015-09-10 NOTE — ED Notes (Signed)
The pt angry about the wait leaving

## 2015-09-10 NOTE — ED Triage Notes (Signed)
Pt. reports abscess at right axilla increasing in size onset Monday this week , no drainage , denies fever or chills.

## 2016-02-02 ENCOUNTER — Emergency Department (HOSPITAL_COMMUNITY)
Admission: EM | Admit: 2016-02-02 | Discharge: 2016-02-02 | Disposition: A | Discharge status: Home / Self Care | Payer: 59 | Attending: Emergency Medicine | Admitting: Emergency Medicine

## 2016-02-02 ENCOUNTER — Emergency Department (HOSPITAL_COMMUNITY): Payer: 59

## 2016-02-02 ENCOUNTER — Encounter (HOSPITAL_COMMUNITY): Payer: Self-pay

## 2016-02-02 DIAGNOSIS — J45909 Unspecified asthma, uncomplicated: Secondary | ICD-10-CM | POA: Insufficient documentation

## 2016-02-02 DIAGNOSIS — J069 Acute upper respiratory infection, unspecified: Secondary | ICD-10-CM | POA: Diagnosis not present

## 2016-02-02 DIAGNOSIS — Z79899 Other long term (current) drug therapy: Secondary | ICD-10-CM | POA: Insufficient documentation

## 2016-02-02 DIAGNOSIS — R05 Cough: Secondary | ICD-10-CM | POA: Diagnosis present

## 2016-02-02 LAB — RAPID STREP SCREEN (MED CTR MEBANE ONLY): Streptococcus, Group A Screen (Direct): NEGATIVE

## 2016-02-02 MED ORDER — ALBUTEROL SULFATE HFA 108 (90 BASE) MCG/ACT IN AERS: 2.0000 | INHALATION_SPRAY | RESPIRATORY_TRACT | Status: DC | PRN

## 2016-02-02 MED ORDER — BENZONATATE 100 MG PO CAPS: 100.0000 mg | ORAL_CAPSULE | Freq: Three times a day (TID) | ORAL | 0 refills | Status: DC | PRN

## 2016-02-02 MED ORDER — DM-GUAIFENESIN ER 30-600 MG PO TB12: 1.0000 | ORAL_TABLET | Freq: Two times a day (BID) | ORAL | 0 refills | Status: DC

## 2016-02-02 NOTE — ED Provider Notes (Signed)
[facility]-EMERGENCY DEPT Provider Note   CSN: [id number] Arrival date & time: 02/02/16  1722  History   Chief Complaint Chief Complaint  Patient presents with  . Influenza    HPI Carol Haney is a 26 y.o. female.  HPI  Patient has as PMH of asthma comes to the ER with complaints of productive cough, fever, chest congestion, and fatigue for over 1 week. She has not had any nausea, vomiting or diarrhea. She is afebrile on arrival. People at her work have been sick with similar symptoms. She has tried Hall's, Theraflu, Chloraseptic spray, nothing seems to be working for her symptoms. Pt is well appearing and smiling.   Pt reports being out of albuterol inhaler.  Past Medical History:  Diagnosis Date  . Asthma     There are no active problems to display for this patient.   Past Surgical History:  Procedure Laterality Date  . CYSTECTOMY    . TONSILLECTOMY      OB History    No data available       Home Medications    Prior to Admission medications   Medication Sig Start Date End Date Taking? Authorizing Provider  benzonatate (TESSALON PERLES) 100 MG capsule Take 1 capsule (100 mg total) by mouth 3 (three) times daily as needed for cough. 02/02/16   Starlyn Droge Neva SeatGreene, PA-C  dextromethorphan-guaiFENesin (MUCINEX DM) 30-600 MG 12hr tablet Take 1 tablet by mouth 2 (two) times daily. 02/02/16   Marlon Peliffany Dametri Ozburn, PA-C  HYDROcodone-acetaminophen (NORCO/VICODIN) 5-325 MG tablet Take 1-2 tablets by mouth every 4 (four) hours as needed for moderate pain. 06/16/15   Armando ReichertHeather D Hogan, CNM  ibuprofen (ADVIL,MOTRIN) 200 MG tablet Take 600 mg by mouth every 6 (six) hours as needed (pain).    Historical Provider, MD  ondansetron (ZOFRAN) 4 MG tablet Take 1 tablet (4 mg total) by mouth every 8 (eight) hours as needed for nausea or vomiting. 01/12/15   Fayrene HelperBowie Tran, PA-C  Pseudoephedrine-APAP-DM (TYLENOL COLD/FLU SEVERE DAY PO) Take 1 tablet by mouth 2 (two) times daily as needed (cold/flu).     Historical Provider, MD  sulfamethoxazole-trimethoprim (BACTRIM DS,SEPTRA DS) 800-160 MG tablet Take 1 tablet by mouth 2 (two) times daily. 06/16/15   Armando ReichertHeather D Hogan, CNM    Family History History reviewed. No pertinent family history.  Social History Social History  Substance Use Topics  . Smoking status: Never Smoker  . Smokeless tobacco: Never Used  . Alcohol use No     Allergies   Patient has no known allergies.   Review of Systems Review of Systems  Review of Systems All other systems negative except as documented in the HPI. All pertinent positives and negatives as reviewed in the HPI.  Physical Exam Updated Vital Signs BP 169/100 (BP Location: Left Arm)   Pulse 99   Temp 98.1 F (36.7 C) (Oral)   Resp 20   Ht 5\' 5"  (1.651 m)   Wt 99.8 kg   LMP 01/02/2016   SpO2 100%   BMI 36.61 kg/m   Physical Exam  Constitutional: She appears well-developed and well-nourished.  HENT:  Head: Normocephalic and atraumatic.  + nasal congestion.  Eyes: Conjunctivae are normal. Pupils are equal, round, and reactive to light.  Neck: Trachea normal, normal range of motion and full passive range of motion without pain. Neck supple.  Cardiovascular: Normal rate, regular rhythm and normal pulses.   Pulmonary/Chest: Effort normal and breath sounds normal. Chest wall is not dull to percussion. She exhibits  no tenderness, no crepitus, no edema, no deformity and no retraction.  Coughing during exam.  Abdominal: Soft. Normal appearance and bowel sounds are normal.  Musculoskeletal: Normal range of motion.  Neurological: She is alert. She has normal strength.  Skin: Skin is warm, dry and intact.  Psychiatric: She has a normal mood and affect. Her speech is normal and behavior is normal. Judgment and thought content normal. Cognition and memory are normal.     ED Treatments / Results  Labs (all labs ordered are listed, but only abnormal results are displayed) Labs Reviewed  RAPID  STREP SCREEN (NOT AT [facility])  CULTURE, GROUP A STREP [facility])    EKG  EKG Interpretation None       Radiology Dg Chest 2 View  Result Date: 02/02/2016 CLINICAL DATA:  Cough and shortness of breath. EXAM: CHEST  2 VIEW COMPARISON:  Chest radiograph 01/12/2015. FINDINGS: Cardiac contours upper limits of normal. Elevation right hemidiaphragm. No large area of pulmonary consolidation. No pleural effusion or pneumothorax. Mid thoracic spine degenerative changes. IMPRESSION: Mild cardiomegaly.  No acute cardiopulmonary process. Electronically Signed   By: Annia Belt M.D.   On: 02/02/2016 18:49    Procedures Procedures (including critical care time)  Medications Ordered in ED Medications  albuterol (PROVENTIL HFA;VENTOLIN HFA) 108 (90 Base) MCG/ACT inhaler 2 puff (not administered)     Initial Impression / Assessment and Plan / ED Course  I have reviewed the triage vital signs and the nursing notes.  Pertinent labs & imaging results that were available during my care of the patient were reviewed by me and considered in my medical decision making (see chart for details).  Discussed high blood pressure in ED and mild cardiomegaly on exam. We discussed her going have it rechecked every few weeks on her own a [facility] or [facility]. Told her that if he continues to be elevated and she needs to see a primary care doctor or go to the urgent care for treatment. Told her of the risks of long-term hypertension.  Pt symptoms consistent with URI. CXR negative for acute infiltrate. Pt will be discharged with symptomatic treatment.  Discussed return precautions.  Pt is hemodynamically stable & in NAD prior to discharge.   Final Clinical Impressions(s) / ED Diagnoses   Final diagnoses:  Upper respiratory tract infection, unspecified type    New Prescriptions New Prescriptions   BENZONATATE (TESSALON PERLES) 100 MG CAPSULE    Take 1 capsule (100 mg total) by mouth 3 (three) times daily as needed for  cough.   DEXTROMETHORPHAN-GUAIFENESIN (MUCINEX DM) 30-600 MG 12HR TABLET    Take 1 tablet by mouth 2 (two) times daily.     Marlon Pel, PA-C 02/02/16 1935    Shaune Pollack, MD 02/04/16 [phone]

## 2016-02-02 NOTE — ED Notes (Signed)
Patient d/c'd self care.  F/U and medications reviewed.  Patient verbalized understanding. 

## 2016-02-02 NOTE — ED Triage Notes (Signed)
PT C/O PRODUCTIVE COUGH, FEVER, CHEST CONGESTION, AND FATIGUE X1 WEEK. PT DENIES N/V/D.

## 2016-02-05 LAB — CULTURE, GROUP A STREP (THRC)

## 2016-02-07 ENCOUNTER — Emergency Department (HOSPITAL_COMMUNITY): Payer: 59

## 2016-02-07 ENCOUNTER — Encounter (HOSPITAL_COMMUNITY): Payer: Self-pay | Admitting: Emergency Medicine

## 2016-02-07 ENCOUNTER — Emergency Department (HOSPITAL_COMMUNITY)
Admission: EM | Admit: 2016-02-07 | Discharge: 2016-02-07 | Disposition: A | Discharge status: Home / Self Care | Payer: 59 | Attending: Emergency Medicine | Admitting: Emergency Medicine

## 2016-02-07 DIAGNOSIS — S6991XA Unspecified injury of right wrist, hand and finger(s), initial encounter: Secondary | ICD-10-CM | POA: Diagnosis present

## 2016-02-07 DIAGNOSIS — Y999 Unspecified external cause status: Secondary | ICD-10-CM | POA: Diagnosis not present

## 2016-02-07 DIAGNOSIS — M7918 Myalgia, other site: Secondary | ICD-10-CM

## 2016-02-07 DIAGNOSIS — Y9389 Activity, other specified: Secondary | ICD-10-CM | POA: Insufficient documentation

## 2016-02-07 DIAGNOSIS — M79641 Pain in right hand: Secondary | ICD-10-CM

## 2016-02-07 DIAGNOSIS — J45909 Unspecified asthma, uncomplicated: Secondary | ICD-10-CM | POA: Insufficient documentation

## 2016-02-07 DIAGNOSIS — Y92410 Unspecified street and highway as the place of occurrence of the external cause: Secondary | ICD-10-CM | POA: Insufficient documentation

## 2016-02-07 MED ORDER — METHOCARBAMOL 500 MG PO TABS: 500.0000 mg | ORAL_TABLET | Freq: Two times a day (BID) | ORAL | 0 refills | Status: DC

## 2016-02-07 MED ORDER — IBUPROFEN 400 MG PO TABS
400.0000 mg | ORAL_TABLET | Freq: Once | ORAL | Status: AC
  Administered 2016-02-07: 400 mg via ORAL
  Filled 2016-02-07: qty 1

## 2016-02-07 MED ORDER — METHOCARBAMOL 500 MG PO TABS
1000.0000 mg | ORAL_TABLET | Freq: Once | ORAL | Status: AC
  Administered 2016-02-07: 1000 mg via ORAL
  Filled 2016-02-07: qty 2
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
## 2016-02-07 NOTE — ED Notes (Signed)
Pt returned from X-ray.  
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
## 2016-04-15 ENCOUNTER — Ambulatory Visit (INDEPENDENT_AMBULATORY_CARE_PROVIDER_SITE_OTHER)
Admission: EM | Admit: 2016-04-15 | Discharge: 2016-04-15 | Disposition: A | Discharge status: Home / Self Care | Payer: 59 | Source: Home / Self Care

## 2016-04-15 ENCOUNTER — Encounter (HOSPITAL_COMMUNITY): Payer: Self-pay | Admitting: Emergency Medicine

## 2016-04-15 DIAGNOSIS — N611 Abscess of the breast and nipple: Secondary | ICD-10-CM | POA: Diagnosis not present

## 2016-04-15 MED ORDER — LIDOCAINE HCL (PF) 1 % IJ SOLN
INTRAMUSCULAR | Status: AC
  Filled 2016-04-15: qty 2

## 2016-04-15 MED ORDER — CEFTRIAXONE SODIUM 1 G IJ SOLR
INTRAMUSCULAR | Status: AC
  Filled 2016-04-15: qty 10

## 2016-04-15 MED ORDER — SULFAMETHOXAZOLE-TRIMETHOPRIM 800-160 MG PO TABS: 1.0000 | ORAL_TABLET | Freq: Two times a day (BID) | ORAL | 0 refills | Status: DC

## 2016-04-15 MED ORDER — CEFTRIAXONE SODIUM 1 G IJ SOLR
1.0000 g | Freq: Once | INTRAMUSCULAR | Status: AC
  Administered 2016-04-15: 1 g via INTRAMUSCULAR

## 2016-04-15 MED ORDER — LIDOCAINE-EPINEPHRINE (PF) 2 %-1:200000 IJ SOLN
INTRAMUSCULAR | Status: AC
  Filled 2016-04-15: qty 20

## 2016-04-15 NOTE — ED Provider Notes (Signed)
CSN: [id number]     Arrival date & time 04/15/16  1817 History   First MD Initiated Contact with Patient 04/15/16 1904     Chief Complaint  Patient presents with  . Abscess   (Consider location/radiation/quality/duration/timing/severity/associated sxs/prior Treatment) 26 year old female presents to clinic for evaluation of abscess on her breast. She has had recurrent abscesses on her breast, previously been drained multiple times in the past but states they keep reoccurring. She denies any fever, chills, nausea, or markers of systemic illness.   The history is provided by the patient.  Abscess  Location:  Torso Torso abscess location:  R breast Size:  5 cm Abscess quality: induration, painful, redness and warmth   Red streaking: no   Duration:  2 days Progression:  Worsening Pain details:    Quality:  Pressure, throbbing and sharp   Severity:  Severe   Duration:  2 days   Timing:  Constant   Progression:  Worsening Chronicity:  Recurrent Relieved by:  Draining/squeezing, oral antibiotics and warm compresses Associated symptoms: no anorexia, no fatigue, no fever, no nausea and no vomiting     Past Medical History:  Diagnosis Date  . Asthma    Past Surgical History:  Procedure Laterality Date  . CYSTECTOMY    . TONSILLECTOMY     No family history on file. Social History  Substance Use Topics  . Smoking status: Never Smoker  . Smokeless tobacco: Never Used  . Alcohol use No   OB History    No data available     Review of Systems  Constitutional: Negative for fatigue and fever.  HENT: Negative.   Respiratory: Negative for cough, chest tightness and shortness of breath.   Cardiovascular: Negative for chest pain and palpitations.  Gastrointestinal: Negative for anorexia, diarrhea, nausea and vomiting.  Genitourinary: Negative.   Musculoskeletal: Negative.   Skin: Positive for color change.       abscess  Neurological: Negative.     Allergies  Patient has no  known allergies.  Home Medications   Prior to Admission medications   Medication Sig Start Date End Date Taking? Authorizing Provider  HYDROcodone-acetaminophen (NORCO/VICODIN) 5-325 MG tablet Take 1-2 tablets by mouth every 4 (four) hours as needed for moderate pain. 06/16/15   Armando Reichert, CNM  ibuprofen (ADVIL,MOTRIN) 200 MG tablet Take 600 mg by mouth every 6 (six) hours as needed (pain).    Historical Provider, MD  methocarbamol (ROBAXIN) 500 MG tablet Take 1 tablet (500 mg total) by mouth 2 (two) times daily. 02/07/16   Audry Pili, PA-C  ondansetron (ZOFRAN) 4 MG tablet Take 1 tablet (4 mg total) by mouth every 8 (eight) hours as needed for nausea or vomiting. 01/12/15   Fayrene Helper, PA-C  sulfamethoxazole-trimethoprim (BACTRIM DS,SEPTRA DS) 800-160 MG tablet Take 1 tablet by mouth 2 (two) times daily. 04/15/16 04/22/16  Dorena Bodo, NP   Meds Ordered and Administered this Visit   Medications  cefTRIAXone (ROCEPHIN) injection 1 g (1 g Intramuscular Given 04/15/16 1939)    BP (!) 141/87 (BP Location: Right Arm)   Pulse 90   Temp 99.5 F (37.5 C) (Oral)   Resp 20   SpO2 97%  No data found.   Physical Exam  Constitutional: She is oriented to person, place, and time. She appears well-developed and well-nourished. No distress.  HENT:  Head: Normocephalic and atraumatic.  Right Ear: External ear normal.  Left Ear: External ear normal.  Neck: Normal range of motion.  Cardiovascular: Normal  rate and regular rhythm.   Approximately 4 x 5 cm abscess under the right breast.  Pulmonary/Chest: Effort normal and breath sounds normal.  Abdominal: Soft. Bowel sounds are normal.  Lymphadenopathy:    She has no cervical adenopathy.  Neurological: She is alert and oriented to person, place, and time.  Skin: Skin is warm and dry. Capillary refill takes less than 2 seconds. She is not diaphoretic.  Psychiatric: She has a normal mood and affect. Her behavior is normal.  Nursing note and  vitals reviewed.   Urgent Care Course     Procedures (including critical care time)  Labs Review Labs Reviewed  AEROBIC/ANAEROBIC CULTURE (SURGICAL/DEEP WOUND)    Imaging Review No results found.      MDM   1. Breast abscess     Abscess was numbed with 1 mL of lidocaine with epinephrine, 18-gauge needle inserted into the abscess, 20 mL of purulent material drained from the abscess. Wound cultures obtained, patient given injection of Rocephin in clinic, discharged on a prescription of Bactrim. Patient was referred to general surgery for further evaluation and management of her condition.     Dorena Bodo, NP 04/15/16 2119

## 2016-04-15 NOTE — Discharge Instructions (Signed)
Your abscess has been drained, we have given you an injection of Rocephin here in clinic, and I prescribed Bactrim as an antibiotic. Take one tablet twice a day for 7 days. I have provided the contact information for [facility] surgery, call them Monday morning and schedule an appointment as soon as possible for further evaluation. Wound cultures of been taken of the draining material, and he will be notified if there are any abnormalities with this test. If at any time you develop a high fever, nausea, vomiting, lethargy, or other concerning symptoms, return to clinic, or go to the emergency room.

## 2016-04-15 NOTE — ED Triage Notes (Signed)
Reports abscess to area below right breast.  Noticed Wednesday.  Patient has been using warm compresses.  Today this area is very painful

## 2016-04-16 ENCOUNTER — Encounter (HOSPITAL_COMMUNITY): Payer: Self-pay | Admitting: Emergency Medicine

## 2016-04-16 DIAGNOSIS — B958 Unspecified staphylococcus as the cause of diseases classified elsewhere: Secondary | ICD-10-CM | POA: Diagnosis present

## 2016-04-16 DIAGNOSIS — R739 Hyperglycemia, unspecified: Secondary | ICD-10-CM | POA: Diagnosis present

## 2016-04-16 DIAGNOSIS — Z8249 Family history of ischemic heart disease and other diseases of the circulatory system: Secondary | ICD-10-CM

## 2016-04-16 DIAGNOSIS — N611 Abscess of the breast and nipple: Principal | ICD-10-CM | POA: Diagnosis present

## 2016-04-16 DIAGNOSIS — J45909 Unspecified asthma, uncomplicated: Secondary | ICD-10-CM | POA: Diagnosis present

## 2016-04-16 DIAGNOSIS — Z6841 Body Mass Index (BMI) 40.0 and over, adult: Secondary | ICD-10-CM

## 2016-04-16 DIAGNOSIS — Z833 Family history of diabetes mellitus: Secondary | ICD-10-CM

## 2016-04-16 DIAGNOSIS — D72829 Elevated white blood cell count, unspecified: Secondary | ICD-10-CM | POA: Diagnosis present

## 2016-04-16 NOTE — ED Triage Notes (Signed)
Pt states she has an abscess on her right breast  Pt was seen at urgent care yesterday and they did a culture on it and started her on Bactrim DS   Pt states today the area is worse so she went back to urgent care and they sent her here  Pt has redness noted the area is hard to touch and hot

## 2016-04-17 ENCOUNTER — Inpatient Hospital Stay (HOSPITAL_COMMUNITY)
Admission: EM | Admit: 2016-04-17 | Discharge: 2016-04-20 | DRG: 600 | Disposition: A | Discharge status: Home / Self Care | Payer: 59 | Attending: Internal Medicine | Admitting: Internal Medicine

## 2016-04-17 DIAGNOSIS — R739 Hyperglycemia, unspecified: Secondary | ICD-10-CM

## 2016-04-17 DIAGNOSIS — N611 Abscess of the breast and nipple: Secondary | ICD-10-CM | POA: Diagnosis present

## 2016-04-17 DIAGNOSIS — R7302 Impaired glucose tolerance (oral): Secondary | ICD-10-CM

## 2016-04-17 DIAGNOSIS — N61 Mastitis without abscess: Secondary | ICD-10-CM | POA: Diagnosis not present

## 2016-04-17 LAB — BASIC METABOLIC PANEL
Anion gap: 8 (ref 5–15)
BUN: 10 mg/dL (ref 6–20)
CALCIUM: 9.2 mg/dL (ref 8.9–10.3)
CO2: 24 mmol/L (ref 22–32)
Chloride: 104 mmol/L (ref 101–111)
Creatinine, Ser: 0.97 mg/dL (ref 0.44–1.00)
GFR calc Af Amer: >60 mL/min (ref >60)
GLUCOSE: 134 mg/dL — AB (ref 65–99)
POTASSIUM: 3.7 mmol/L (ref 3.5–5.1)
Sodium: 136 mmol/L (ref 135–145)

## 2016-04-17 LAB — CBC WITH DIFFERENTIAL/PLATELET
Basophils Absolute: 0 10*3/uL (ref 0.0–0.1)
Basophils Relative: 0 %
EOS PCT: 1 %
Eosinophils Absolute: 0.1 10*3/uL (ref 0.0–0.7)
HEMATOCRIT: 38.7 % (ref 36.0–46.0)
Hemoglobin: 12.7 g/dL (ref 12.0–15.0)
LYMPHS ABS: 2.1 10*3/uL (ref 0.7–4.0)
LYMPHS PCT: 20 %
MCH: 26 pg (ref 26.0–34.0)
MCHC: 32.8 g/dL (ref 30.0–36.0)
MCV: 79.3 fL (ref 78.0–100.0)
MONO ABS: 0.8 10*3/uL (ref 0.1–1.0)
Monocytes Relative: 7 %
NEUTROS ABS: 7.6 10*3/uL (ref 1.7–7.7)
Neutrophils Relative %: 72 %
PLATELETS: 408 10*3/uL — AB (ref 150–400)
RBC: 4.88 MIL/uL (ref 3.87–5.11)
RDW: 14.4 % (ref 11.5–15.5)
WBC: 10.6 10*3/uL — ABNORMAL HIGH (ref 4.0–10.5)

## 2016-04-17 LAB — LACTIC ACID, PLASMA: LACTIC ACID, VENOUS: 1.3 mmol/L (ref 0.5–1.9)

## 2016-04-17 LAB — HIV ANTIBODY (ROUTINE TESTING W REFLEX): HIV SCREEN 4TH GENERATION: NONREACTIVE

## 2016-04-17 LAB — POC URINE PREG, ED: Preg Test, Ur: NEGATIVE

## 2016-04-17 MED ORDER — ONDANSETRON HCL 4 MG/2ML IJ SOLN: 4.0000 mg | Freq: Four times a day (QID) | INTRAMUSCULAR | Status: DC | PRN

## 2016-04-17 MED ORDER — SODIUM CHLORIDE 0.9% FLUSH
3.0000 mL | Freq: Two times a day (BID) | INTRAVENOUS | Status: DC
  Administered 2016-04-17 – 2016-04-19 (×4): 3 mL via INTRAVENOUS

## 2016-04-17 MED ORDER — CLINDAMYCIN PHOSPHATE 600 MG/50ML IV SOLN
600.0000 mg | Freq: Once | INTRAVENOUS | Status: AC
  Administered 2016-04-17: 600 mg via INTRAVENOUS
  Filled 2016-04-17: qty 50

## 2016-04-17 MED ORDER — SENNOSIDES-DOCUSATE SODIUM 8.6-50 MG PO TABS
2.0000 | ORAL_TABLET | Freq: Every day | ORAL | Status: DC
  Administered 2016-04-17 – 2016-04-19 (×3): 2 via ORAL
  Filled 2016-04-17 (×3): qty 2

## 2016-04-17 MED ORDER — SODIUM CHLORIDE 0.9% FLUSH: 3.0000 mL | INTRAVENOUS | Status: DC | PRN

## 2016-04-17 MED ORDER — PIPERACILLIN-TAZOBACTAM 3.375 G IVPB 30 MIN
3.3750 g | Freq: Once | INTRAVENOUS | Status: AC
  Administered 2016-04-17: 3.375 g via INTRAVENOUS
  Filled 2016-04-17: qty 50

## 2016-04-17 MED ORDER — PIPERACILLIN-TAZOBACTAM 3.375 G IVPB
3.3750 g | Freq: Three times a day (TID) | INTRAVENOUS | Status: DC
  Administered 2016-04-17 – 2016-04-20 (×8): 3.375 g via INTRAVENOUS
  Filled 2016-04-17 (×10): qty 50

## 2016-04-17 MED ORDER — HYDROCODONE-ACETAMINOPHEN 5-325 MG PO TABS
1.0000 | ORAL_TABLET | Freq: Once | ORAL | Status: AC
  Administered 2016-04-17: 1 via ORAL
  Filled 2016-04-17: qty 1

## 2016-04-17 MED ORDER — SODIUM CHLORIDE 0.9 % IV SOLN
INTRAVENOUS | Status: DC
  Administered 2016-04-17: 10:00:00 via INTRAVENOUS

## 2016-04-17 MED ORDER — ACETAMINOPHEN 650 MG RE SUPP: 650.0000 mg | Freq: Four times a day (QID) | RECTAL | Status: DC | PRN

## 2016-04-17 MED ORDER — SODIUM CHLORIDE 0.9 % IV SOLN: 250.0000 mL | INTRAVENOUS | Status: DC | PRN

## 2016-04-17 MED ORDER — OXYCODONE-ACETAMINOPHEN 5-325 MG PO TABS
1.0000 | ORAL_TABLET | ORAL | Status: DC | PRN
  Administered 2016-04-17: 1 via ORAL
  Filled 2016-04-17: qty 1

## 2016-04-17 MED ORDER — ONDANSETRON HCL 4 MG PO TABS
4.0000 mg | ORAL_TABLET | Freq: Four times a day (QID) | ORAL | Status: DC | PRN
Start: 2016-04-17 — End: 2016-04-20

## 2016-04-17 MED ORDER — SODIUM CHLORIDE 0.9 % IV BOLUS (SEPSIS)
1000.0000 mL | Freq: Once | INTRAVENOUS | Status: AC
  Administered 2016-04-17: 1000 mL via INTRAVENOUS

## 2016-04-17 MED ORDER — ENOXAPARIN SODIUM 40 MG/0.4ML ~~LOC~~ SOLN
40.0000 mg | SUBCUTANEOUS | Status: DC
  Administered 2016-04-17 – 2016-04-20 (×4): 40 mg via SUBCUTANEOUS
  Filled 2016-04-17 (×4): qty 0.4

## 2016-04-17 MED ORDER — LIDOCAINE-EPINEPHRINE (PF) 2 %-1:200000 IJ SOLN
10.0000 mL | Freq: Once | INTRAMUSCULAR | Status: AC
  Administered 2016-04-17: 10 mL
  Filled 2016-04-17: qty 20

## 2016-04-17 MED ORDER — ACETAMINOPHEN 325 MG PO TABS: 650.0000 mg | ORAL_TABLET | Freq: Four times a day (QID) | ORAL | Status: DC | PRN

## 2016-04-17 MED ORDER — IBUPROFEN 200 MG PO TABS
400.0000 mg | ORAL_TABLET | Freq: Four times a day (QID) | ORAL | Status: DC
  Administered 2016-04-17 – 2016-04-20 (×13): 400 mg via ORAL
  Filled 2016-04-17 (×13): qty 2

## 2016-04-17 NOTE — ED Notes (Signed)
hospitalist remains at bedside. Pt will be transported once available. Nurse is aware on 5E.

## 2016-04-17 NOTE — ED Provider Notes (Signed)
[facility]-EMERGENCY DEPT Provider Note   CSN: [id number] Arrival date & time: 04/16/16  2159     History   Chief Complaint Chief Complaint  Patient presents with  . Abscess    HPI Carol Haney is a 26 y.o. female.  Patient without significant medical history presents with painful, swollen areas to right breast.  Symptoms started 2-3 days ago. No fever. She was seen at Urgent Care on 04/15/16 and reports they aspirated the area of diagnosed abscess and started her on Bactrim. She went back to Urgent Care today for evaluation of worsening symptoms and was told to come to the emergency department for further evaluation. No drainage.    The history is provided by the patient. No language interpreter was used.  Abscess  Associated symptoms: no fever, no nausea and no vomiting     Past Medical History:  Diagnosis Date  . Asthma     There are no active problems to display for this patient.   Past Surgical History:  Procedure Laterality Date  . CYSTECTOMY    . TONSILLECTOMY      OB History    No data available       Home Medications    Prior to Admission medications   Medication Sig Start Date End Date Taking? Authorizing Provider  ibuprofen (ADVIL,MOTRIN) 200 MG tablet Take 600 mg by mouth every 6 (six) hours as needed (pain).   Yes Historical Provider, MD  sulfamethoxazole-trimethoprim (BACTRIM DS,SEPTRA DS) 800-160 MG tablet Take 1 tablet by mouth 2 (two) times daily. 04/15/16 04/22/16 Yes Dorena Bodo, NP  HYDROcodone-acetaminophen (NORCO/VICODIN) 5-325 MG tablet Take 1-2 tablets by mouth every 4 (four) hours as needed for moderate pain. Patient not taking: Reported on 04/17/2016 06/16/15   Armando Reichert, CNM  methocarbamol (ROBAXIN) 500 MG tablet Take 1 tablet (500 mg total) by mouth 2 (two) times daily. Patient not taking: Reported on 04/17/2016 02/07/16   Audry Pili, PA-C  ondansetron (ZOFRAN) 4 MG tablet Take 1 tablet (4 mg total) by mouth every 8 (eight) hours as  needed for nausea or vomiting. Patient not taking: Reported on 04/17/2016 01/12/15   Fayrene Helper, PA-C    Family History Family History  Problem Relation Age of Onset  . Diabetes Other   . Hypertension Other   . Hyperlipidemia Other   . CAD Other     Social History Social History  Substance Use Topics  . Smoking status: Never Smoker  . Smokeless tobacco: Never Used  . Alcohol use No     Allergies   Patient has no known allergies.   Review of Systems Review of Systems  Constitutional: Negative for chills and fever.  Respiratory: Negative.  Negative for cough.   Cardiovascular: Positive for chest pain.       See HPI.  Gastrointestinal: Negative.  Negative for abdominal pain, nausea and vomiting.  Musculoskeletal: Negative.  Negative for myalgias.  Skin: Positive for color change and wound.  Neurological: Negative.  Negative for weakness.     Physical Exam Updated Vital Signs BP (!) 145/96 (BP Location: Left Arm)   Pulse 93   Temp 98.8 F (37.1 C) (Oral)   Resp 16   LMP 04/13/2016 (Exact Date)   SpO2 99%   Physical Exam  Constitutional: She is oriented to person, place, and time. She appears well-developed and well-nourished.  Neck: Normal range of motion.  Cardiovascular:  Right breast has two raised bullus type lesions at medial border without drainage. There is  redness and induration that extends to medial areola.  Pulmonary/Chest: Effort normal. No respiratory distress.  Neurological: She is alert and oriented to person, place, and time.  Skin: Skin is warm and dry. There is erythema.     ED Treatments / Results  Labs (all labs ordered are listed, but only abnormal results are displayed) Labs Reviewed  CBC WITH DIFFERENTIAL/PLATELET - Abnormal; Notable for the following:       Result Value   WBC 10.6 (*)    Platelets 408 (*)    All other components within normal limits  AEROBIC CULTURE (SUPERFICIAL SPECIMEN)  BASIC METABOLIC PANEL  POC URINE PREG,  ED   Results for orders placed or performed during the hospital encounter of 04/17/16  CBC with Differential  Result Value Ref Range   WBC 10.6 (H) 4.0 - 10.5 K/uL   RBC 4.88 3.87 - 5.11 MIL/uL   Hemoglobin 12.7 12.0 - 15.0 g/dL   HCT 45.4 09.8 - 11.9 %   MCV 79.3 78.0 - 100.0 fL   MCH 26.0 26.0 - 34.0 pg   MCHC 32.8 30.0 - 36.0 g/dL   RDW 14.7 82.9 - 56.2 %   Platelets 408 (H) 150 - 400 K/uL   Neutrophils Relative % 72 %   Neutro Abs 7.6 1.7 - 7.7 K/uL   Lymphocytes Relative 20 %   Lymphs Abs 2.1 0.7 - 4.0 K/uL   Monocytes Relative 7 %   Monocytes Absolute 0.8 0.1 - 1.0 K/uL   Eosinophils Relative 1 %   Eosinophils Absolute 0.1 0.0 - 0.7 K/uL   Basophils Relative 0 %   Basophils Absolute 0.0 0.0 - 0.1 K/uL  Basic metabolic panel  Result Value Ref Range   Sodium 136 135 - 145 mmol/L   Potassium 3.7 3.5 - 5.1 mmol/L   Chloride 104 101 - 111 mmol/L   CO2 24 22 - 32 mmol/L   Glucose, Bld 134 (H) 65 - 99 mg/dL   BUN 10 6 - 20 mg/dL   Creatinine, Ser 1.30 0.44 - 1.00 mg/dL   Calcium 9.2 8.9 - 86.5 mg/dL   GFR calc non Af Amer >60 >60 mL/min   GFR calc Af Amer >60 >60 mL/min   Anion gap 8 5 - 15  POC Urine Pregnancy, ED (do NOT order at [facility])  Result Value Ref Range   Preg Test, Ur NEGATIVE NEGATIVE    EKG  EKG Interpretation None       Radiology No results found.  Procedures Procedures (including critical care time) INCISION AND DRAINAGE Performed by: Elpidio Anis A Consent: Verbal consent obtained. Risks and benefits: risks, benefits and alternatives were discussed Type: abscess  Body area: right breast  Anesthesia: local infiltration  Incision was made with a scalpel.  Local anesthetic: lidocaine 2% w/epinephrine  Anesthetic total: 2 ml  Complexity: complex Blunt dissection to break up loculations  Drainage: purulent  Drainage amount: copious, malodorous No wound exploration given abscess located in breast   Packing material: no packing  used  Patient tolerance: Patient tolerated the procedure well with no immediate complications.    Medications Ordered in ED Medications  clindamycin (CLEOCIN) IVPB 600 mg (not administered)  HYDROcodone-acetaminophen (NORCO/VICODIN) 5-325 MG per tablet 1 tablet (1 tablet Oral Given 04/17/16 0323)     Initial Impression / Assessment and Plan / ED Course  I have reviewed the triage vital signs and the nursing notes.  Pertinent labs & imaging results that were available during my care of  the patient were reviewed by me and considered in my medical decision making (see chart for details).     Patient presents with abscess and worsening cellulitis to right breast. She has been on antibiotic and has worsened despite. No fever, vomiting.   Labs reassuring. VSS. She is nontoxic in appearance. It is felt admission is in the patient's best interest as the infection is worsening while on antibiotics. Discussed with Dr. Clyde Lundborg, [facility], who accepts the patient for admission.  Final Clinical Impressions(s) / ED Diagnoses   Final diagnoses:  None   1. Right breast abscess 2. Right breast cellulitis 3. Failure of outpatient treatment of infection  New Prescriptions New Prescriptions   No medications on file     Elpidio Anis, PA-C 04/17/16 0701

## 2016-04-17 NOTE — Progress Notes (Signed)
Pharmacy Antibiotic Follow-up Note  Carol Haney is a 26 y.o. year-old female admitted on 04/17/2016.  The patient is currently on day 1 of Zosyn for recurrent breast abscess. Started Bactrim as outpatient 4/8 ordered x 7 days.  Assessment/Plan: Zosyn 3.375g IV q8h (4 hour infusion).  Temp (24hrs), Avg:98.8 F (37.1 C), Min:98.7 F (37.1 C), Max:98.8 F (37.1 C)   Recent Labs Lab 04/17/16 0320  WBC 10.6*    Recent Labs Lab 04/17/16 0320  CREATININE 0.97   CrCl cannot be calculated (Unknown ideal weight.).    No Known Allergies  Antimicrobials this admission: 4/10 Clindamyin x1 4/10 Zosyn >>   Microbiology results: 4/10 BCx: sent 4/10 Wound: sent  4/8   Deep wound: ngtd, abundant GNR, GPC   Thank you for allowing pharmacy to be a part of this patient's care.  Otho Bellows PharmD 04/17/2016 10:12 AM

## 2016-04-17 NOTE — ED Provider Notes (Signed)
Medical screening examination/treatment/procedure(s) were conducted as a shared visit with non-physician practitioner(s) and myself.  I personally evaluated the patient during the encounter.   EKG Interpretation None      Pt is a 26 y.o. female who presents emergency department with right breast abscess. Was aspirated urgent care and she is placed on antibiotics. Cellulitis getting worse. Reports subjective fevers, chills, nausea. On exam patient has significant cellulitis to the right breast with area of fluctuance underneath the medial aspect of the right breast. Hemodynamically stable here. Has mild leukocytosis. Pregnancy test negative. Will give IV clindamycin. We have opened this abscess for relief of pain and symptoms. Patient agrees to admission. Wound culture has been sent.    Layla Maw Ward, DO 04/17/16 [phone]

## 2016-04-17 NOTE — Consult Note (Signed)
Reason for Consult: breast abscess Referring Physician: Dr Alecia Lemming is an 26 y.o. female.  HPI: 25 y.o. F with a h/o of recurrent breast abscesses.  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  She presented to the ED last night with worsening pain.  She is s/p aspiration in the breast clinic last week.  She underwent an I&D in the ED.  Past Medical History:  Diagnosis Date  . Asthma     Past Surgical History:  Procedure Laterality Date  . CYSTECTOMY    . TONSILLECTOMY      Family History  Problem Relation Age of Onset  . Diabetes Other   . Hypertension Other   . Hyperlipidemia Other   . CAD Other     Social History:  reports that she has never smoked. She has never used smokeless tobacco. She reports that she does not drink alcohol or use drugs.  Allergies: No Known Allergies  Medications: I have reviewed the patient's current medications.  Results for orders placed or performed during the hospital encounter of 04/17/16 (from the past 48 hour(s))  CBC with Differential     Status: Abnormal   Collection Time: 04/17/16  3:20 AM  Result Value Ref Range   WBC 10.6 (H) 4.0 - 10.5 K/uL   RBC 4.88 3.87 - 5.11 MIL/uL   Hemoglobin 12.7 12.0 - 15.0 g/dL   HCT 38.7 36.0 - 46.0 %   MCV 79.3 78.0 - 100.0 fL   MCH 26.0 26.0 - 34.0 pg   MCHC 32.8 30.0 - 36.0 g/dL   RDW 14.4 11.5 - 15.5 %   Platelets 408 (H) 150 - 400 K/uL   Neutrophils Relative % 72 %   Neutro Abs 7.6 1.7 - 7.7 K/uL   Lymphocytes Relative 20 %   Lymphs Abs 2.1 0.7 - 4.0 K/uL   Monocytes Relative 7 %   Monocytes Absolute 0.8 0.1 - 1.0 K/uL   Eosinophils Relative 1 %   Eosinophils Absolute 0.1 0.0 - 0.7 K/uL   Basophils Relative 0 %   Basophils Absolute 0.0 0.0 - 0.1 K/uL  Basic metabolic panel     Status: Abnormal   Collection Time: 04/17/16  3:20 AM  Result Value Ref Range   Sodium 136 135 - 145 mmol/L   Potassium 3.7 3.5 - 5.1 mmol/L   Chloride 104 101 - 111 mmol/L   CO2 24 22 - 32 mmol/L   Glucose, Bld 134 (H) 65 - 99 mg/dL   BUN 10 6 - 20 mg/dL   Creatinine, Ser 0.97 0.44 - 1.00 mg/dL   Calcium 9.2 8.9 - 10.3 mg/dL   GFR calc non Af Amer >60 >60 mL/min   GFR calc Af Amer >60 >60 mL/min    Comment: (NOTE) The eGFR has been calculated using the CKD EPI equation. This calculation has not been validated in all clinical situations. eGFR's persistently <60 mL/min signify possible Chronic Kidney Disease.    Anion gap 8 5 - 15  POC Urine Pregnancy, ED (do NOT order at [facility])     Status: None   Collection Time: 04/17/16  4:06 AM  Result Value Ref Range   Preg Test, Ur NEGATIVE NEGATIVE    Comment:        THE SENSITIVITY OF THIS METHODOLOGY IS >24 mIU/mL   Lactic acid, plasma     Status: None   Collection Time: 04/17/16  7:09 AM  Result Value Ref Range  Lactic Acid, Venous 1.3 0.5 - 1.9 mmol/L    No results found.  Review of Systems  Constitutional: Negative for fever.  HENT: Negative for congestion and hearing loss.   Eyes: Negative for blurred vision and double vision.  Respiratory: Negative for cough and shortness of breath.   Cardiovascular: Negative for chest pain and palpitations.  Gastrointestinal: Negative for nausea and vomiting.  Genitourinary: Negative for dysuria and urgency.  Musculoskeletal: Negative for myalgias.  Skin: Negative for itching and rash.   Blood pressure (!) 142/89, pulse 90, temperature 98.7 F (37.1 C), temperature source Oral, resp. rate 20, last menstrual period 04/13/2016, SpO2 100 %. Physical Exam  Constitutional: She is oriented to person, place, and time. She appears well-developed and well-nourished.  HENT:  Head: Normocephalic and atraumatic.  Eyes: Conjunctivae and EOM are normal. Pupils are equal, round, and reactive to light.  Neck: Normal range of motion. Neck supple.  Cardiovascular: Normal rate and regular rhythm.   Respiratory: Effort normal and breath sounds normal.  GI: Soft. She exhibits no  distension.  Neurological: She is alert and oriented to person, place, and time.  Skin:  Right breast with medial wound draining purulence    Assessment/Plan: 26 y.o. F with recurrent breast abscesses.  She is s/p I&D last night in the ED and her wound is draining well.  Would continue abx and allow the area to drain.  F/u wound cultures to direct therapy.  Recommend f/u in our office with a breast surgeon within the next 1-2 weeks to discuss any need for further surgery, given multiple recurrences.  Will sign off.  Please call with any questions or concerns.  Sharlena Kristensen C. [id number]/[id number], 0:76 AM

## 2016-04-17 NOTE — Progress Notes (Signed)
This is a no charge note  Pending admission per PA, [place].  26 year old lady with past medical history for asthma, who presents with abscess to the area below her right breast. Patient was seen in urgent care, was aspirated urgent care. She is placed on bactrim, but symptoms have been worsening. Also has cellulitis. Reports subjective fevers, chills, nausea. EDP did I&D, but may not have broken the loculations. WBC 10.6, temperature normal.  IV clindamycin started. Fluid culture and blood culture were ordered. Pt is accepted to med-surg for obs  Lorretta Harp, MD  [facility] Pager [phone]  If 7PM-7AM, please contact night-coverage [email or web address] Password [facility] 04/17/2016, 6:56 AM   .

## 2016-04-17 NOTE — H&P (Addendum)
History and Physical    SHANEIL YAZDI ZOX:[id number] DOB: September 04, 1990 DOA: 04/17/2016    PCP: Hal Morales, MD  Patient coming from: home  Chief Complaint: right breast abscess  HPI: Carol Haney is a 26 y.o. female with medical history of multiple breast abscesses, asthma not on maintenance treatment, obesity who presents for a right breast abscess. She has had abscesses before that have been managed by her OB/Gyn, Dr Pennie Rushing. Last one was about 1 month ago.  She was seen on 4/8 in urgent care for this one. She states pus was drawn out with a needle. Per notes, she received Rocephin and was discharged on Bactrim. She returns as it has not improved. She has not noted any drainage at home. No fevers or chills.    ED Course:  Received Hydrocodone, Clindamycin and 1 L NS - I and D done in ER.  Review of Systems:  Admits to 50 lb weight gain in past year.  Checked for diabetes about 2 yrs ago - was told she does not have it.  All other systems reviewed and apart from HPI, are negative.  Past Medical History:  Diagnosis Date  . Asthma     Past Surgical History:  Procedure Laterality Date  . CYSTECTOMY    . TONSILLECTOMY      Social History:   reports that she has never smoked. She has never used smokeless tobacco. She reports that she does not drink alcohol or use drugs.  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  No Known Allergies  Family History  Problem Relation Age of Onset  . Diabetes Other   . Hypertension Other   . Hyperlipidemia Other   . CAD Other      Prior to Admission medications   Medication Sig Start Date End Date Taking? Authorizing Provider  ibuprofen (ADVIL,MOTRIN) 200 MG tablet Take 600 mg by mouth every 6 (six) hours as needed (pain).   Yes Historical Provider, MD  sulfamethoxazole-trimethoprim (BACTRIM DS,SEPTRA DS) 800-160 MG tablet Take 1 tablet by mouth 2 (two) times daily. 04/15/16 04/22/16 Yes Dorena Bodo, NP    HYDROcodone-acetaminophen (NORCO/VICODIN) 5-325 MG tablet Take 1-2 tablets by mouth every 4 (four) hours as needed for moderate pain. Patient not taking: Reported on 04/17/2016 06/16/15   Armando Reichert, CNM  methocarbamol (ROBAXIN) 500 MG tablet Take 1 tablet (500 mg total) by mouth 2 (two) times daily. Patient not taking: Reported on 04/17/2016 02/07/16   Audry Pili, PA-C  ondansetron (ZOFRAN) 4 MG tablet Take 1 tablet (4 mg total) by mouth every 8 (eight) hours as needed for nausea or vomiting. Patient not taking: Reported on 04/17/2016 01/12/15   Fayrene Helper, PA-C    Physical Exam: Vitals:   04/16/16 2233 04/17/16 0248 04/17/16 0646  BP: 136/90 (!) 145/96 130/81  Pulse: 93 93 79  Resp: 20 16 (!) 21  Temp: 98.8 F (37.1 C)    TempSrc: Oral    SpO2: 100% 99% 100%      Constitutional: NAD, calm, comfortable Eyes: PERTLA, lids and conjunctivae normal ENMT: Mucous membranes are moist. Posterior pharynx clear of any exudate or lesions. Normal dentition.  Neck: normal, supple, no masses, no thyromegaly Respiratory: clear to auscultation bilaterally, no wheezing, no crackles. Normal respiratory effort. No accessory muscle use.  Cardiovascular: S1 & S2 heard, regular rate and rhythm, no murmurs / rubs / gallops. No extremity edema. 2+ pedal pulses. No carotid bruits.  Abdomen: No distension, no tenderness, no  masses palpated. No hepatosplenomegaly. Bowel sounds normal.  Musculoskeletal: no clubbing / cyanosis. No joint deformity upper and lower extremities. Good ROM, no contractures. Normal muscle tone.  Skin: erythema along lower aspect of right breast, abscess draining pus. Neurologic: CN 2-12 grossly intact. Sensation intact, DTR normal. Strength 5/5 in all 4 limbs.  Psychiatric: Normal judgment and insight. Alert and oriented x 3. Normal mood.     Labs on Admission: I have personally reviewed following labs and imaging studies  CBC:  Recent Labs Lab 04/17/16 0320  WBC 10.6*   NEUTROABS 7.6  HGB 12.7  HCT 38.7  MCV 79.3  PLT 408*   Basic Metabolic Panel:  Recent Labs Lab 04/17/16 0320  NA 136  K 3.7  CL 104  CO2 24  GLUCOSE 134*  BUN 10  CREATININE 0.97  CALCIUM 9.2   GFR: CrCl cannot be calculated (Unknown ideal weight.). Liver Function Tests: No results for input(s): AST, ALT, ALKPHOS, BILITOT, PROT, ALBUMIN in the last 168 hours. No results for input(s): LIPASE, AMYLASE in the last 168 hours. No results for input(s): AMMONIA in the last 168 hours. Coagulation Profile: No results for input(s): INR, PROTIME in the last 168 hours. Cardiac Enzymes: No results for input(s): CKTOTAL, CKMB, CKMBINDEX, TROPONINI in the last 168 hours. BNP (last 3 results) No results for input(s): PROBNP in the last 8760 hours. HbA1C: No results for input(s): HGBA1C in the last 72 hours. CBG: No results for input(s): GLUCAP in the last 168 hours. Lipid Profile: No results for input(s): CHOL, HDL, LDLCALC, TRIG, CHOLHDL, LDLDIRECT in the last 72 hours. Thyroid Function Tests: No results for input(s): TSH, T4TOTAL, FREET4, T3FREE, THYROIDAB in the last 72 hours. Anemia Panel: No results for input(s): VITAMINB12, FOLATE, FERRITIN, TIBC, IRON, RETICCTPCT in the last 72 hours. Urine analysis:    Component Value Date/Time   COLORURINE RED BIOCHEMICALS MAY BE AFFECTED BY COLOR (A) 06/23/2007 2217   APPEARANCEUR CLOUDY (A) 06/23/2007 2217   LABSPEC 1.028 06/23/2007 2217   PHURINE 6.0 06/23/2007 2217   GLUCOSEU NEGATIVE 06/23/2007 2217   HGBUR LARGE (A) 06/23/2007 2217   BILIRUBINUR NEGATIVE 06/23/2007 2217   KETONESUR 15 (A) 06/23/2007 2217   PROTEINUR 30 (A) 06/23/2007 2217   UROBILINOGEN 1.0 06/23/2007 2217   NITRITE NEGATIVE 06/23/2007 2217   LEUKOCYTESUR SMALL (A) 06/23/2007 2217   Sepsis Labs: (procalcitonin:4,lacticidven:4) ) Recent Results (from the past 240 hour(s))  Aerobic/Anaerobic Culture (surgical/deep wound)     Status: None  (Preliminary result)   Collection Time: 04/15/16  7:31 PM  Result Value Ref Range Status   Specimen Description ABSCESS  Final   Special Requests NONE  Final   Gram Stain   Final    ABUNDANT WBC PRESENT, PREDOMINANTLY PMN ABUNDANT GRAM NEGATIVE RODS ABUNDANT IN PAIRS IN CHAINS ABUNDANT GRAM POSITIVE COCCI IN CLUSTERS FEW GRAM POSITIVE RODS    Culture NO GROWTH < 24 HOURS  Final   Report Status PENDING  Incomplete     Radiological Exams on Admission: No results found.     Assessment/Plan Principal Problem:   Abscess of breast  - MRSA swab, HIV - pain control - routine Ibuprofen, PRN Norco - f/u cultures that were obtained on 4/8- polymicrobial organisms noted  - have asked Gen surgery to evaluate as well - start Zosyn- given Clilnda in ER  Mild leukocytosis and thrombocytosis - follow- may be due to infection  Hyperglycemia - at risk for DM- check A1c  Morbid obesity - have asked her  to work on losing weight   DVT prophylaxis: Lovenox  Code Status: full code  Family Communication:   Disposition Plan: med/surg  Consults called:  gen surgery Admission status: observation    Calvert Cantor MD [facility] Pager: [email or web address] Password [email or web address] 7PM-7AM, please contact night-coverage   04/17/2016, 7:47 AM

## 2016-04-18 DIAGNOSIS — N611 Abscess of the breast and nipple: Principal | ICD-10-CM

## 2016-04-18 DIAGNOSIS — E66813 Obesity, class 3: Secondary | ICD-10-CM

## 2016-04-18 DIAGNOSIS — R7302 Impaired glucose tolerance (oral): Secondary | ICD-10-CM

## 2016-04-18 DIAGNOSIS — Z8249 Family history of ischemic heart disease and other diseases of the circulatory system: Secondary | ICD-10-CM | POA: Diagnosis not present

## 2016-04-18 DIAGNOSIS — Z6841 Body Mass Index (BMI) 40.0 and over, adult: Secondary | ICD-10-CM | POA: Diagnosis not present

## 2016-04-18 DIAGNOSIS — J45909 Unspecified asthma, uncomplicated: Secondary | ICD-10-CM | POA: Diagnosis present

## 2016-04-18 DIAGNOSIS — B958 Unspecified staphylococcus as the cause of diseases classified elsewhere: Secondary | ICD-10-CM | POA: Diagnosis present

## 2016-04-18 DIAGNOSIS — R739 Hyperglycemia, unspecified: Secondary | ICD-10-CM | POA: Diagnosis present

## 2016-04-18 DIAGNOSIS — N61 Mastitis without abscess: Secondary | ICD-10-CM | POA: Diagnosis not present

## 2016-04-18 DIAGNOSIS — Z833 Family history of diabetes mellitus: Secondary | ICD-10-CM | POA: Diagnosis not present

## 2016-04-18 DIAGNOSIS — D72829 Elevated white blood cell count, unspecified: Secondary | ICD-10-CM | POA: Diagnosis present

## 2016-04-18 LAB — CBC
HCT: 34.9 % — ABNORMAL LOW (ref 36.0–46.0)
HEMOGLOBIN: 11.3 g/dL — AB (ref 12.0–15.0)
MCH: 25.7 pg — AB (ref 26.0–34.0)
MCHC: 32.4 g/dL (ref 30.0–36.0)
MCV: 79.3 fL (ref 78.0–100.0)
PLATELETS: 352 10*3/uL (ref 150–400)
RBC: 4.4 MIL/uL (ref 3.87–5.11)
RDW: 14.4 % (ref 11.5–15.5)
WBC: 6.6 10*3/uL (ref 4.0–10.5)

## 2016-04-18 LAB — HEMOGLOBIN A1C
HEMOGLOBIN A1C: 5.8 % — AB (ref 4.8–5.6)
MEAN PLASMA GLUCOSE: 120 mg/dL

## 2016-04-18 NOTE — Progress Notes (Signed)
PROGRESS NOTE  Carol Haney:[id number] DOB: 1990-07-08 DOA: 04/17/2016 PCP: Hal Morales, MD  Brief History:  26 year old female with a history of recurrent breast abscesses and asthma presented with increasing pain and erythema on her right breast. The patient had a breast abscess approximately 1 month ago managed by her OB/GYN. The patient went to urgent care on 04/15/2016. Needle aspiration was performed, the patient was discharged with Bactrim DS. Unfortunately, she continued to have pain and erythema. She went back to urgent care who sent her to the emergency department on 04/17/2016. The patient had a bedside I&D in the emergency department. She was started on IV antibiotics pending culture data.  Assessment/Plan: Right breast abscess -Continue Zosyn pending culture data -appreciate Surgery consult-->no urgent surgery needed presently; follow up office 1-2 weeks -chaperone present during exam  Impaired glucose tolerance -Hemoglobin A1c 5.8 -Lifestyle modification  Morbid obesity -BMI 45.7   Disposition Plan:   Home 4/12 or 4/13 Family Communication:   No Family at bedside  Consultants:  General surgery  Code Status:  FULL  DVT Prophylaxis:   [place] Lovenox   Procedures: As Listed in Progress Note Above  Antibiotics: None    Subjective: Patient says the pain in her right breast is improving. Denies any fevers, chills, chest pain, shortness breath, nausea, vomiting, diarrhea, abdominal pain. No dysuria or hematuria.  Objective: Vitals:   04/17/16 1812 04/17/16 1824 04/17/16 2034 04/18/16 0552  BP:   (!) 110/54 124/61  Pulse:   91 71  Resp:   20 20  Temp: (!) 95 F (35 C) 97.9 F (36.6 C) 98 F (36.7 C) 97.6 F (36.4 C)  TempSrc: Oral Axillary Oral Oral  SpO2:   97% 96%  Weight:      Height:        Intake/Output Summary (Last 24 hours) at 04/18/16 1004 Last data filed at 04/18/16 0330  Gross per 24 hour  Intake              340 ml    Output                0 ml  Net              340 ml   Weight change:  Exam:   General:  Pt is alert, follows commands appropriately, not in acute distress  HEENT: No icterus, No thrush, No neck mass, [place]/AT  Cardiovascular: RRR, S1/S2, no rubs, no gallops  Respiratory: CTA bilaterally, no wheezing, no crackles, no rhonchi.Medial aspect of the right breast with 4 cm induration and erythema extending circumferentially. No necrosis. No crepitance.;   Abdomen: Soft/+BS, non tender, non distended, no guarding  Extremities: No edema, No lymphangitis, No petechiae, No rashes, no synovitis   Data Reviewed: I have personally reviewed following labs and imaging studies Basic Metabolic Panel:  Recent Labs Lab 04/17/16 0320  NA 136  K 3.7  CL 104  CO2 24  GLUCOSE 134*  BUN 10  CREATININE 0.97  CALCIUM 9.2   Liver Function Tests: No results for input(s): AST, ALT, ALKPHOS, BILITOT, PROT, ALBUMIN in the last 168 hours. No results for input(s): LIPASE, AMYLASE in the last 168 hours. No results for input(s): AMMONIA in the last 168 hours. Coagulation Profile: No results for input(s): INR, PROTIME in the last 168 hours. CBC:  Recent Labs Lab 04/17/16 0320 04/18/16 0628  WBC 10.6* 6.6  NEUTROABS 7.6  --  HGB 12.7 11.3*  HCT 38.7 34.9*  MCV 79.3 79.3  PLT 408* 352   Cardiac Enzymes: No results for input(s): CKTOTAL, CKMB, CKMBINDEX, TROPONINI in the last 168 hours. BNP: Invalid input(s): POCBNP CBG: No results for input(s): GLUCAP in the last 168 hours. HbA1C:  Recent Labs  04/17/16 0822  HGBA1C 5.8*   Urine analysis:    Component Value Date/Time   COLORURINE RED BIOCHEMICALS MAY BE AFFECTED BY COLOR (A) 06/23/2007 2217   APPEARANCEUR CLOUDY (A) 06/23/2007 2217   LABSPEC 1.028 06/23/2007 2217   PHURINE 6.0 06/23/2007 2217   GLUCOSEU NEGATIVE 06/23/2007 2217   HGBUR LARGE (A) 06/23/2007 2217   BILIRUBINUR NEGATIVE 06/23/2007 2217   KETONESUR 15 (A)  06/23/2007 2217   PROTEINUR 30 (A) 06/23/2007 2217   UROBILINOGEN 1.0 06/23/2007 2217   NITRITE NEGATIVE 06/23/2007 2217   LEUKOCYTESUR SMALL (A) 06/23/2007 2217   Sepsis Labs: (procalcitonin:4,lacticidven:4) ) Recent Results (from the past 240 hour(s))  Aerobic/Anaerobic Culture (surgical/deep wound)     Status: None (Preliminary result)   Collection Time: 04/15/16  7:31 PM  Result Value Ref Range Status   Specimen Description ABSCESS  Final   Special Requests NONE  Final   Gram Stain   Final    ABUNDANT WBC PRESENT, PREDOMINANTLY PMN ABUNDANT GRAM NEGATIVE RODS ABUNDANT IN PAIRS IN CHAINS ABUNDANT GRAM POSITIVE COCCI IN CLUSTERS FEW GRAM POSITIVE RODS    Culture HOLDING FOR POSSIBLE ANAEROBE  Final   Report Status PENDING  Incomplete  Wound or Superficial Culture     Status: None (Preliminary result)   Collection Time: 04/17/16  4:34 AM  Result Value Ref Range Status   Specimen Description BREAST  Final   Special Requests NONE  Final   Gram Stain   Final    FEW WBC PRESENT,BOTH PMN AND MONONUCLEAR FEW GRAM POSITIVE COCCI IN PAIRS Performed at [facility], [address] [place], [place] [address]    Culture PENDING  Incomplete   Report Status PENDING  Incomplete     Scheduled Meds: . enoxaparin (LOVENOX) injection  40 mg Subcutaneous Q24H  . ibuprofen  400 mg Oral QID  . piperacillin-tazobactam (ZOSYN)  IV  3.375 g Intravenous Q8H  . senna-docusate  2 tablet Oral QHS  . sodium chloride flush  3 mL Intravenous Q12H   Continuous Infusions:  Procedures/Studies: No results found.  Dagen Beevers, DO  [facility] Pager [phone]  If 7PM-7AM, please contact night-coverage [email or web address] Password [email or web address] 04/18/2016, 10:04 AM   LOS: 0 days

## 2016-04-19 LAB — NASAL CULTURE (N/P): Culture: NORMAL

## 2016-04-19 LAB — BASIC METABOLIC PANEL
Anion gap: 7 (ref 5–15)
BUN: 12 mg/dL (ref 6–20)
CALCIUM: 8.6 mg/dL — AB (ref 8.9–10.3)
CO2: 26 mmol/L (ref 22–32)
Chloride: 104 mmol/L (ref 101–111)
Creatinine, Ser: 0.84 mg/dL (ref 0.44–1.00)
GFR calc Af Amer: >60 mL/min (ref >60)
GLUCOSE: 92 mg/dL (ref 65–99)
Potassium: 3.9 mmol/L (ref 3.5–5.1)
Sodium: 137 mmol/L (ref 135–145)

## 2016-04-19 LAB — MAGNESIUM: Magnesium: 1.9 mg/dL (ref 1.7–2.4)

## 2016-04-19 NOTE — Progress Notes (Signed)
PROGRESS NOTE  Carol Haney YNW:[id number] DOB: Feb 22, 1990 DOA: 04/17/2016 PCP: Hal Morales, MD  Brief History:  26 year old female with a history of recurrent breast abscesses and asthma presented with increasing pain and erythema on her right breast. The patient had a breast abscess approximately 1 month ago managed by her OB/GYN. The patient went to urgent care on 04/15/2016. Needle aspiration was performed, the patient was discharged with Bactrim DS. Unfortunately, she continued to have pain and erythema. She went back to urgent care who sent her to the emergency department on 04/17/2016. The patient had a bedside I&D in the emergency department. She was started on IV antibiotics pending culture data.  Assessment/Plan: Right breast abscess -Continue Zosyn pending culture data -appreciate Surgery consult-->no urgent surgery needed presently; follow up office 1-2 weeks -chaperone present during exam  Impaired glucose tolerance -Hemoglobin A1c 5.8 -Lifestyle modification  Morbid obesity -BMI 45.7   Disposition Plan:   Home 4/12 or 4/13 Family Communication:   No Family at bedside  Consultants:  General surgery  Code Status:  FULL  DVT Prophylaxis:   [place] Lovenox   Procedures: As Listed in Progress Note Above  Antibiotics: Zosyn 04/17/16>>>    Subjective: Patient states the pain in her right breast is improving. There is erythema that is also improving. No fever, chills, chest pain, shortness breath, nausea, vomiting, diarrhea.  Objective: Vitals:   04/18/16 1345 04/18/16 2212 04/19/16 0553 04/19/16 1347  BP: (!) 142/76 124/80 117/61 121/71  Pulse: 75 74 69 85  Resp: Temp: 98.4 F (36.9 C) 98 F (36.7 C) 97.1 F (36.2 C) 97.9 F (36.6 C)  TempSrc: Oral Oral Oral Oral  SpO2: 98% 99% 100% 97%  Weight:      Height:        Intake/Output Summary (Last 24 hours) at 04/19/16 1912 Last data filed at 04/19/16 1300  Gross  per 24 hour  Intake              950 ml  Output                0 ml  Net              950 ml   Weight change:  Exam:   General:  Pt is alert, follows commands appropriately, not in acute distress  HEENT: No icterus, No thrush, No neck mass, [place]/AT  Cardiovascular: RRR, S1/S2, no rubs, no gallops  Respiratory: CTA bilaterally, no wheezing, no crackles, no rhonchi  Abdomen: Soft/+BS, non tender, non distended, no guarding  Extremities: No edema, No lymphangitis, No petechiae, No rashes, no synovitis; right breast with mild erythema and induration on the medial aspect around 5:00. No crepitance. No necrosis.   Data Reviewed: I have personally reviewed following labs and imaging studies Basic Metabolic Panel:  Recent Labs Lab 04/17/16 0320 04/19/16 0631  NA 136 137  K 3.7 3.9  CL 104 104  CO2 24 26  GLUCOSE 134* 92  BUN 10 12  CREATININE 0.97 0.84  CALCIUM 9.2 8.6*  MG  --  1.9   Liver Function Tests: No results for input(s): AST, ALT, ALKPHOS, BILITOT, PROT, ALBUMIN in the last 168 hours. No results for input(s): LIPASE, AMYLASE in the last 168 hours. No results for input(s): AMMONIA in the last 168 hours. Coagulation Profile: No results for input(s): INR, PROTIME in the last 168 hours. CBC:  Recent Labs Lab  04/17/16 0320 04/18/16 0628  WBC 10.6* 6.6  NEUTROABS 7.6  --   HGB 12.7 11.3*  HCT 38.7 34.9*  MCV 79.3 79.3  PLT 408* 352   Cardiac Enzymes: No results for input(s): CKTOTAL, CKMB, CKMBINDEX, TROPONINI in the last 168 hours. BNP: Invalid input(s): POCBNP CBG: No results for input(s): GLUCAP in the last 168 hours. HbA1C:  Recent Labs  04/17/16 0822  HGBA1C 5.8*   Urine analysis:    Component Value Date/Time   COLORURINE RED BIOCHEMICALS MAY BE AFFECTED BY COLOR (A) 06/23/2007 2217   APPEARANCEUR CLOUDY (A) 06/23/2007 2217   LABSPEC 1.028 06/23/2007 2217   PHURINE 6.0 06/23/2007 2217   GLUCOSEU NEGATIVE 06/23/2007 2217   HGBUR LARGE (A)  06/23/2007 2217   BILIRUBINUR NEGATIVE 06/23/2007 2217   KETONESUR 15 (A) 06/23/2007 2217   PROTEINUR 30 (A) 06/23/2007 2217   UROBILINOGEN 1.0 06/23/2007 2217   NITRITE NEGATIVE 06/23/2007 2217   LEUKOCYTESUR SMALL (A) 06/23/2007 2217   Sepsis Labs: (procalcitonin:4,lacticidven:4) ) Recent Results (from the past 240 hour(s))  Aerobic/Anaerobic Culture (surgical/deep wound)     Status: None (Preliminary result)   Collection Time: 04/15/16  7:31 PM  Result Value Ref Range Status   Specimen Description ABSCESS  Final   Special Requests NONE  Final   Gram Stain   Final    ABUNDANT WBC PRESENT, PREDOMINANTLY PMN ABUNDANT GRAM NEGATIVE RODS ABUNDANT IN PAIRS IN CHAINS ABUNDANT GRAM POSITIVE COCCI IN CLUSTERS FEW GRAM POSITIVE RODS    Culture   Final    CULTURE REINCUBATED FOR BETTER GROWTH HOLDING FOR POSSIBLE ANAEROBE    Report Status PENDING  Incomplete  Wound or Superficial Culture     Status: None (Preliminary result)   Collection Time: 04/17/16  4:34 AM  Result Value Ref Range Status   Specimen Description BREAST  Final   Special Requests NONE  Final   Gram Stain   Final    FEW WBC PRESENT,BOTH PMN AND MONONUCLEAR FEW GRAM POSITIVE COCCI IN PAIRS    Culture   Final    MODERATE STAPHYLOCOCCUS SPECIES (COAGULASE NEGATIVE) SUSCEPTIBILITIES TO FOLLOW Performed at [facility] Lab, [address] [place], [place] [address]    Report Status PENDING  Incomplete  Culture, blood (Routine X 2) w Reflex to ID Panel     Status: None (Preliminary result)   Collection Time: 04/17/16  7:09 AM  Result Value Ref Range Status   Specimen Description BLOOD LEFT ARM  Final   Special Requests BOTTLES DRAWN AEROBIC AND ANAEROBIC BCAV  Final   Culture   Final    NO GROWTH 2 DAYS Performed at [facility] Lab, [address], [place], [place] [address]    Report Status PENDING  Incomplete  Culture, blood (Routine X 2) w Reflex to ID Panel     Status: None (Preliminary  result)   Collection Time: 04/17/16  7:09 AM  Result Value Ref Range Status   Specimen Description BLOOD RIGHT ARM  Final   Special Requests BOTTLES DRAWN AEROBIC AND ANAEROBIC BCAV  Final   Culture   Final    NO GROWTH 2 DAYS Performed at [facility] Lab, [address] [place], [place] [address]    Report Status PENDING  Incomplete  Nasal culture     Status: None   Collection Time: 04/17/16 11:23 AM  Result Value Ref Range Status   Specimen Description NOSE  Final   Special Requests NONE  Final   Culture  Final    NORMAL NASOPHARYNGEAL FLORA Performed at [facility] Lab, [address] [place], [place] [address]    Report Status 04/19/2016 FINAL  Final     Scheduled Meds: . enoxaparin (LOVENOX) injection  40 mg Subcutaneous Q24H  . ibuprofen  400 mg Oral QID  . piperacillin-tazobactam (ZOSYN)  IV  3.375 g Intravenous Q8H  . senna-docusate  2 tablet Oral QHS  . sodium chloride flush  3 mL Intravenous Q12H   Continuous Infusions:  Procedures/Studies: No results found.  Coral Timme, DO  [facility] Pager [phone]  If 7PM-7AM, please contact night-coverage [email or web address] Password [email or web address] 04/19/2016, 7:12 PM   LOS: 1 day

## 2016-04-20 LAB — AEROBIC/ANAEROBIC CULTURE W GRAM STAIN (SURGICAL/DEEP WOUND)

## 2016-04-20 LAB — AEROBIC/ANAEROBIC CULTURE (SURGICAL/DEEP WOUND)

## 2016-04-20 LAB — AEROBIC CULTURE W GRAM STAIN (SUPERFICIAL SPECIMEN)

## 2016-04-20 LAB — AEROBIC CULTURE  (SUPERFICIAL SPECIMEN)

## 2016-04-20 MED ORDER — CLINDAMYCIN HCL 300 MG PO CAPS: 300.0000 mg | ORAL_CAPSULE | Freq: Four times a day (QID) | ORAL | 0 refills | Status: DC

## 2016-04-20 MED ORDER — CLINDAMYCIN HCL 300 MG PO CAPS
300.0000 mg | ORAL_CAPSULE | Freq: Four times a day (QID) | ORAL | Status: DC
  Administered 2016-04-20: 300 mg via ORAL
  Filled 2016-04-20 (×2): qty 1

## 2016-04-20 NOTE — Discharge Summary (Signed)
Physician Discharge Summary  Carol Haney:[id number] DOB: 29-Jun-1990 DOA: 04/17/2016  PCP: Hal Morales, MD  Admit date: 04/17/2016 Discharge date: 04/20/2016  Admitted From: Home Disposition:  Home   Recommendations for Outpatient Follow-up:  1. Follow up with PCP in 1-2 weeks 2. Please obtain BMP/CBC in one week   Home Health: NO Equipment/Devices: NA  Discharge Condition: Stable CODE STATUS: FULL Diet recommendation: Heart Healthy   Brief/Interim Summary: 26 year old female with a history of recurrent breast abscesses and asthma presented with increasing pain and erythema on her right breast. The patient had a breast abscess approximately 1 month ago managed by her OB/GYN. The patient went to urgent care on 04/15/2016. Needle aspiration was performed, the patient was discharged with Bactrim DS. Unfortunately, she continued to have pain and erythema. She went back to urgent care who sent her to the emergency department on 04/17/2016. The patient had a bedside I&D in the emergency department. She was started on IV antibiotics pending culture data.  Cultures from aspiration grew coagulase-negative Staphylococcus. The patient was switched to oral clindamycin for discharge.  Discharge Diagnoses:  Right breast abscess -Continue Zosyn pending culture data -cultures grew CoNS-->home with clinda x 7 more days to complete 10 days of therapy -appreciate Surgery consult-->no urgent surgery needed presently; follow up office 1-2 weeks -chaperone present during exams  Impaired glucose tolerance -Hemoglobin A1c 5.8 -Lifestyle modification  Morbid obesity -BMI 45.7   Discharge Instructions  Discharge Instructions    Diet - low sodium heart healthy    Complete by:  As directed    Increase activity slowly    Complete by:  As directed      Allergies as of 04/20/2016   No Known Allergies     Medication List    STOP taking these medications     HYDROcodone-acetaminophen 5-325 MG tablet Commonly known as:  NORCO/VICODIN   methocarbamol 500 MG tablet Commonly known as:  ROBAXIN   ondansetron 4 MG tablet Commonly known as:  ZOFRAN   sulfamethoxazole-trimethoprim 800-160 MG tablet Commonly known as:  BACTRIM DS,SEPTRA DS     TAKE these medications   clindamycin 300 MG capsule Commonly known as:  CLEOCIN Take 1 capsule (300 mg total) by mouth every 6 (six) hours.   ibuprofen 200 MG tablet Commonly known as:  ADVIL,MOTRIN Take 600 mg by mouth every 6 (six) hours as needed (pain).       No Known Allergies  Consultations:  General surgery   Procedures/Studies: No results found.      Discharge Exam: Vitals:   04/19/16 2126 04/20/16 0406  BP: 134/74 133/78  Pulse: 85 78  Resp: 18 18  Temp: 98.1 F (36.7 C) 98.6 F (37 C)   Vitals:   04/19/16 0553 04/19/16 1347 04/19/16 2126 04/20/16 0406  BP: 117/61 121/71 134/74 133/78  Pulse: 69 85 85 78  Resp: Temp: 97.1 F (36.2 C) 97.9 F (36.6 C) 98.1 F (36.7 C) 98.6 F (37 C)  TempSrc: Oral Oral Oral Oral  SpO2: 100% 97% 98% 98%  Weight:      Height:        General: Pt is alert, awake, not in acute distress Cardiovascular: RRR, S1/S2 +, no rubs, no gallops Respiratory: CTA bilaterally, no wheezing, no rhonchi Abdominal: Soft, NT, ND, bowel sounds + Extremities: no edema, no cyanosis   The results of significant diagnostics from this hospitalization (including imaging, microbiology, ancillary and laboratory) are listed below for reference.  Significant Diagnostic Studies: No results found.   Microbiology: Recent Results (from the past 240 hour(s))  Aerobic/Anaerobic Culture (surgical/deep wound)     Status: None (Preliminary result)   Collection Time: 04/15/16  7:31 PM  Result Value Ref Range Status   Specimen Description ABSCESS  Final   Special Requests NONE  Final   Gram Stain   Final    ABUNDANT WBC PRESENT,  PREDOMINANTLY PMN ABUNDANT GRAM NEGATIVE RODS ABUNDANT IN PAIRS IN CHAINS ABUNDANT GRAM POSITIVE COCCI IN CLUSTERS FEW GRAM POSITIVE RODS    Culture   Final    CULTURE REINCUBATED FOR BETTER GROWTH MIXED ANAEROBIC FLORA PRESENT.  CALL LAB IF FURTHER IID REQUIRED.    Report Status PENDING  Incomplete  Wound or Superficial Culture     Status: None   Collection Time: 04/17/16  4:34 AM  Result Value Ref Range Status   Specimen Description BREAST  Final   Special Requests NONE  Final   Gram Stain   Final    FEW WBC PRESENT,BOTH PMN AND MONONUCLEAR FEW GRAM POSITIVE COCCI IN PAIRS Performed at [facility] Lab, [address], [place], [place] [address]    Culture   Final    MODERATE STAPHYLOCOCCUS SPECIES (COAGULASE NEGATIVE)   Report Status 04/20/2016 FINAL  Final   Organism ID, Bacteria STAPHYLOCOCCUS SPECIES (COAGULASE NEGATIVE)  Final      Susceptibility   Staphylococcus species (coagulase negative) - MIC*    CIPROFLOXACIN <=0.5 SENSITIVE Sensitive     ERYTHROMYCIN >=8 RESISTANT Resistant     GENTAMICIN <=0.5 SENSITIVE Sensitive     OXACILLIN >=4 RESISTANT Resistant     TETRACYCLINE 2 SENSITIVE Sensitive     VANCOMYCIN 1 SENSITIVE Sensitive     TRIMETH/SULFA 160 RESISTANT Resistant     CLINDAMYCIN <=0.25 SENSITIVE Sensitive     RIFAMPIN <=0.5 SENSITIVE Sensitive     Inducible Clindamycin NEGATIVE Sensitive     * MODERATE STAPHYLOCOCCUS SPECIES (COAGULASE NEGATIVE)  Culture, blood (Routine X 2) w Reflex to ID Panel     Status: None (Preliminary result)   Collection Time: 04/17/16  7:09 AM  Result Value Ref Range Status   Specimen Description BLOOD LEFT ARM  Final   Special Requests BOTTLES DRAWN AEROBIC AND ANAEROBIC BCAV  Final   Culture   Final    NO GROWTH 3 DAYS Performed at [facility] Lab, [address] [place], [place] [address]    Report Status PENDING  Incomplete  Culture, blood (Routine X 2) w Reflex to ID Panel     Status: None (Preliminary result)    Collection Time: 04/17/16  7:09 AM  Result Value Ref Range Status   Specimen Description BLOOD RIGHT ARM  Final   Special Requests BOTTLES DRAWN AEROBIC AND ANAEROBIC BCAV  Final   Culture   Final    NO GROWTH 3 DAYS Performed at [facility] Lab, [address], [place], [place] [address]    Report Status PENDING  Incomplete  Nasal culture     Status: None   Collection Time: 04/17/16 11:23 AM  Result Value Ref Range Status   Specimen Description NOSE  Final   Special Requests NONE  Final   Culture   Final    NORMAL NASOPHARYNGEAL FLORA Performed at [facility] Lab, [address], [place], [place] [address]    Report Status 04/19/2016 FINAL  Final     Labs: Basic Metabolic Panel:  Recent Labs Lab 04/17/16 0320 04/19/16 2956  NA 136 137  K 3.7 3.9  CL 104 104  CO2 24 26  GLUCOSE 134* 92  BUN 10 12  CREATININE 0.97 0.84  CALCIUM 9.2 8.6*  MG  --  1.9   Liver Function Tests: No results for input(s): AST, ALT, ALKPHOS, BILITOT, PROT, ALBUMIN in the last 168 hours. No results for input(s): LIPASE, AMYLASE in the last 168 hours. No results for input(s): AMMONIA in the last 168 hours. CBC:  Recent Labs Lab 04/17/16 0320 04/18/16 0628  WBC 10.6* 6.6  NEUTROABS 7.6  --   HGB 12.7 11.3*  HCT 38.7 34.9*  MCV 79.3 79.3  PLT 408* 352   Cardiac Enzymes: No results for input(s): CKTOTAL, CKMB, CKMBINDEX, TROPONINI in the last 168 hours. BNP: Invalid input(s): POCBNP CBG: No results for input(s): GLUCAP in the last 168 hours.  Time coordinating discharge:  Greater than 30 minutes  Signed:  Buddy Loeffelholz, DO [facility] Pager: [phone] 04/20/2016, 12:36 PM

## 2016-04-20 NOTE — Progress Notes (Signed)
Date: April 20, 2016 Discharge orders review for case management needs.  None found Marcelle Smiling, BSN, [place], [place]: [phone]

## 2016-04-22 LAB — CULTURE, BLOOD (ROUTINE X 2)
CULTURE: NO GROWTH
Culture: NO GROWTH

## 2016-04-24 ENCOUNTER — Telehealth (HOSPITAL_COMMUNITY): Payer: Self-pay | Admitting: Internal Medicine

## 2016-04-24 NOTE — Telephone Encounter (Signed)
Opened in error  LM 

## 2016-11-16 ENCOUNTER — Encounter (HOSPITAL_COMMUNITY): Payer: Self-pay | Admitting: Emergency Medicine

## 2016-11-16 ENCOUNTER — Other Ambulatory Visit: Payer: Self-pay

## 2016-11-16 DIAGNOSIS — L03319 Cellulitis of trunk, unspecified: Secondary | ICD-10-CM | POA: Diagnosis not present

## 2016-11-16 DIAGNOSIS — N611 Abscess of the breast and nipple: Secondary | ICD-10-CM | POA: Insufficient documentation

## 2016-11-16 NOTE — ED Triage Notes (Signed)
Pt states she has an abscess on her right breast

## 2016-11-17 ENCOUNTER — Emergency Department (HOSPITAL_COMMUNITY)
Admission: EM | Admit: 2016-11-17 | Discharge: 2016-11-17 | Disposition: A | Discharge status: Home / Self Care | Payer: 59 | Attending: Emergency Medicine | Admitting: Emergency Medicine

## 2016-11-17 DIAGNOSIS — N61 Mastitis without abscess: Secondary | ICD-10-CM

## 2016-11-17 DIAGNOSIS — N611 Abscess of the breast and nipple: Secondary | ICD-10-CM

## 2016-11-17 MED ORDER — ACETAMINOPHEN 325 MG PO TABS
650.0000 mg | ORAL_TABLET | Freq: Once | ORAL | Status: AC
  Administered 2016-11-17: 650 mg via ORAL
  Filled 2016-11-17: qty 2

## 2016-11-17 MED ORDER — SULFAMETHOXAZOLE-TRIMETHOPRIM 800-160 MG PO TABS
1.0000 | ORAL_TABLET | Freq: Once | ORAL | Status: AC
Start: 2016-11-17 — End: 2016-11-17
  Administered 2016-11-17: 1 via ORAL
  Filled 2016-11-17: qty 1

## 2016-11-17 MED ORDER — BACITRACIN ZINC 500 UNIT/GM EX OINT: 1.0000 "application " | TOPICAL_OINTMENT | Freq: Two times a day (BID) | CUTANEOUS | 1 refills | Status: DC

## 2016-11-17 MED ORDER — NAPROXEN 375 MG PO TABS: 375.0000 mg | ORAL_TABLET | Freq: Two times a day (BID) | ORAL | 0 refills | Status: DC

## 2016-11-17 MED ORDER — LIDOCAINE-EPINEPHRINE (PF) 2 %-1:200000 IJ SOLN
10.0000 mL | Freq: Once | INTRAMUSCULAR | Status: AC
  Administered 2016-11-17: 10 mL
  Filled 2016-11-17: qty 20

## 2016-11-17 MED ORDER — SULFAMETHOXAZOLE-TRIMETHOPRIM 800-160 MG PO TABS: 1.0000 | ORAL_TABLET | Freq: Two times a day (BID) | ORAL | 0 refills | Status: AC

## 2016-11-17 NOTE — ED Provider Notes (Signed)
[facility]   CSN: [id number] Arrival date & time: 11/16/16  2342     History   Chief Complaint Chief Complaint  Patient presents with  . Abscess    HPI Carol Haney is a 26 y.o. female.  Carol Haney is a 26 y.o. Female who presents to the emergency department complaining of an abscess to her right breast.  She reports is been ongoing for about 5 days now.  She reports it has been growing in size.  She is attempted warm compresses without relief.  No discharge from the area.  She denies any fevers.  She reports she has had abscesses to this area about 4 times previously.  She is never followed up with a breast surgeon before.  Patient tells me she was previously using Hibiclens as a body wash about every 3 days but stopped using this about 3 weeks ago.  She has not had an abscess since April until this visit today.  She denies fevers, chest pain, shortness of breath or discharge from the area.   The history is provided by the patient and medical records. No language interpreter was used.  Abscess  Associated symptoms: no fever     Past Medical History:  Diagnosis Date  . Asthma     Patient Active Problem List   Diagnosis Date Noted  . Impaired glucose tolerance 04/18/2016  . Obesity, Class III, BMI 40-49.9 (morbid obesity) (HCC) 04/18/2016  . Cellulitis of right breast   . Abscess of breast 04/17/2016    Past Surgical History:  Procedure Laterality Date  . CYSTECTOMY    . TONSILLECTOMY      OB History    No data available       Home Medications    Prior to Admission medications   Medication Sig Start Date End Date Taking? Authorizing Provider  bacitracin ointment Apply 1 application 2 (two) times daily topically. 11/17/16   Everlene Farrier, PA-C  clindamycin (CLEOCIN) 300 MG capsule Take 1 capsule (300 mg total) by mouth every 6 (six) hours. 04/20/16   Catarina Hartshorn, MD  ibuprofen (ADVIL,MOTRIN) 200 MG  tablet Take 600 mg by mouth every 6 (six) hours as needed (pain).    [provider]  naproxen (NAPROSYN) 375 MG tablet Take 1 tablet (375 mg total) 2 (two) times daily with a meal by mouth. 11/17/16   Everlene Farrier, PA-C  sulfamethoxazole-trimethoprim (BACTRIM DS,SEPTRA DS) 800-160 MG tablet Take 1 tablet 2 (two) times daily for 7 days by mouth. 11/17/16 11/24/16  Everlene Farrier, PA-C    Family History Family History  Problem Relation Age of Onset  . Diabetes Other   . Hypertension Other   . Hyperlipidemia Other   . CAD Other     Social History Social History   Tobacco Use  . Smoking status: Never Smoker  . Smokeless tobacco: Never Used  Substance Use Topics  . Alcohol use: No  . Drug use: No     Allergies   Patient has no known allergies.   Review of Systems Review of Systems  Constitutional: Negative for chills and fever.  Respiratory: Negative for shortness of breath.   Cardiovascular: Negative for chest pain.  Skin: Positive for color change and rash.     Physical Exam Updated Vital Signs BP (!) 148/87 (BP Location: Right Arm)   Pulse 87   Temp 98.2 F (36.8 C) (Oral)   Resp 18   Ht 5' 5.5" (1.664  m)   Wt 117.9 kg (260 lb)   LMP 10/22/2016 (Approximate)   SpO2 97%   BMI 42.61 kg/m   Physical Exam  Constitutional: She appears well-developed and well-nourished. No distress.  HENT:  Head: Normocephalic and atraumatic.  Eyes: Right eye exhibits no discharge. Left eye exhibits no discharge.  Pulmonary/Chest: Effort normal. No respiratory distress.  Large approximately 6 cm area of induration and fluctuance noted to the right medial aspect of her breast. No discharge.  Overlying erythema without surrounding erythema. It is not near her nipple.   Neurological: She is alert. Coordination normal.  Skin: Skin is warm and dry. No rash noted. She is not diaphoretic. No pallor.  Psychiatric: She has a normal mood and affect. Her behavior is normal.    Nursing note and vitals reviewed.    ED Treatments / Results  Labs (all labs ordered are listed, but only abnormal results are displayed) Labs Reviewed - No data to display  EKG  EKG Interpretation None       Radiology No results found.  Procedures .Marland Kitchen.Incision and Drainage Date/Time: 11/17/2016 3:18 AM Performed by: Everlene Farrieransie, Jevaeh Shams, PA-C Authorized by: Everlene Farrieransie, Jelesa Mangini, PA-C   Consent:    Consent obtained:  Verbal   Consent given by:  Patient   Risks discussed:  Bleeding, damage to other organs, infection, pain and incomplete drainage   Alternatives discussed:  No treatment, delayed treatment, alternative treatment, observation and referral Location:    Type:  Abscess   Size:  6x4 cm- large    Location:  Trunk   Trunk location:  R breast Pre-procedure details:    Skin preparation:  Betadine Procedure type:    Complexity:  Complex Procedure details:    Needle aspiration: yes     Needle size:  18 G   Drainage:  Purulent   Drainage amount:  Copious   Wound treatment:  Wound left open   Packing materials:  None Post-procedure details:    Patient tolerance of procedure:  Tolerated well, no immediate complications   (including critical care time)  EMERGENCY DEPARTMENT US SOFT TISSUE INTERPRETATION "Study: Limited Soft Tissue Ultrasound"  INDICATIONS: Soft tissue infection Multiple views of the body part were obtained in real-time with a multi-frequency linear probe  PERFORMED BY: Myself IMAGES ARCHIVED?: Yes SIDE:Right  BODY PART:Breast INTERPRETATION:  Abcess present and Cellulitis present     Medications Ordered in ED Medications  lidocaine-EPINEPHrine (XYLOCAINE W/EPI) 2 %-1:200000 (PF) injection 10 mL (not administered)  sulfamethoxazole-trimethoprim (BACTRIM DS,SEPTRA DS) 800-160 MG per tablet 1 tablet (not administered)  acetaminophen (TYLENOL) tablet 650 mg (not administered)     Initial Impression / Assessment and Plan / ED Course  I have  reviewed the triage vital signs and the nursing notes.  Pertinent labs & imaging results that were available during my care of the patient were reviewed by me and considered in my medical decision making (see chart for details).     This  is a 26 y.o. Female who presents to the emergency department complaining of an abscess to her right breast.  She reports is been ongoing for about 5 days now.  She reports it has been growing in size.  She is attempted warm compresses without relief.  No discharge from the area.  She denies any fevers.  She reports she has had abscesses to this area about 4 times previously.  She is never followed up with a breast surgeon before.  Patient tells me she was previously using Hibiclens  as a body wash about every 3 days but stopped using this about 3 weeks ago.  She has not had an abscess since April until this visit today.  On exam the patient does have a large abscess to medial aspect of her right breast.  It is about 6 x 4 cm.  There is overlying erythema without surrounding erythema.  There is surrounding induration.  It is fluctuant.  No discharge.  On ultrasound there is evidence of large abscess with cellulitis. I discussed treatment options with the patient.  After discussion we elected for needle aspiration due to cosmetic reasons.  The area was numbed and I drained approximately 14 cc of purulent material from her right breast abscess.  Patient tolerated the procedure very well.  She reports last menstrual cycle was 10/26/2016.  She is not sexually active.  She denies any chance she is pregnant.  Provide her with her first dose of Bactrim.  I encouraged her to restart using the Hibiclens as a body wash as a seem to help.  I also very much encouraged her to follow-up with breast surgery.  I provided her with information for follow-up with the wellness center, the women's outpatient clinic in the breast center.  Strict and specific return precautions were discussed. I  advised the patient to follow-up with their primary care provider this week. I advised the patient to return to the emergency department with new or worsening symptoms or new concerns. The patient verbalized understanding and agreement with plan.     Final Clinical Impressions(s) / ED Diagnoses   Final diagnoses:  Breast abscess  Cellulitis of right breast    ED Discharge Orders        Ordered    sulfamethoxazole-trimethoprim (BACTRIM DS,SEPTRA DS) 800-160 MG tablet  2 times daily     11/17/16 0313    naproxen (NAPROSYN) 375 MG tablet  2 times daily with meals     11/17/16 0313    bacitracin ointment  2 times daily     11/17/16 0313       Everlene FarrierDansie, Joelle Flessner, PA-C 11/17/16 0322    Molpus, Jonny RuizJohn, MD 11/17/16 [phone]

## 2017-02-21 ENCOUNTER — Encounter (HOSPITAL_COMMUNITY): Payer: Self-pay | Admitting: Emergency Medicine

## 2017-02-21 ENCOUNTER — Other Ambulatory Visit: Payer: Self-pay

## 2017-02-21 ENCOUNTER — Emergency Department (HOSPITAL_COMMUNITY)
Admission: EM | Admit: 2017-02-21 | Discharge: 2017-02-21 | Disposition: A | Discharge status: Home / Self Care | Payer: 59 | Attending: Emergency Medicine | Admitting: Emergency Medicine

## 2017-02-21 DIAGNOSIS — Z5321 Procedure and treatment not carried out due to patient leaving prior to being seen by health care provider: Secondary | ICD-10-CM | POA: Insufficient documentation

## 2017-02-21 DIAGNOSIS — K5900 Constipation, unspecified: Secondary | ICD-10-CM | POA: Insufficient documentation

## 2017-02-21 DIAGNOSIS — M25552 Pain in left hip: Secondary | ICD-10-CM | POA: Insufficient documentation

## 2017-02-21 LAB — URINALYSIS, ROUTINE W REFLEX MICROSCOPIC
BILIRUBIN URINE: NEGATIVE
Glucose, UA: NEGATIVE mg/dL
HGB URINE DIPSTICK: NEGATIVE
Ketones, ur: NEGATIVE mg/dL
NITRITE: NEGATIVE
PH: 7 (ref 5.0–8.0)
Protein, ur: NEGATIVE mg/dL
SPECIFIC GRAVITY, URINE: 1.018 (ref 1.005–1.030)

## 2017-02-21 LAB — CBC
HCT: 40 % (ref 36.0–46.0)
HEMOGLOBIN: 13 g/dL (ref 12.0–15.0)
MCH: 26.9 pg (ref 26.0–34.0)
MCHC: 32.5 g/dL (ref 30.0–36.0)
MCV: 82.8 fL (ref 78.0–100.0)
Platelets: 378 10*3/uL (ref 150–400)
RBC: 4.83 MIL/uL (ref 3.87–5.11)
RDW: 15 % (ref 11.5–15.5)
WBC: 8 10*3/uL (ref 4.0–10.5)

## 2017-02-21 LAB — LIPASE, BLOOD: Lipase: 25 U/L (ref 11–51)

## 2017-02-21 LAB — COMPREHENSIVE METABOLIC PANEL
ALBUMIN: 4 g/dL (ref 3.5–5.0)
ALK PHOS: 62 U/L (ref 38–126)
ALT: 31 U/L (ref 14–54)
ANION GAP: 9 (ref 5–15)
AST: 28 U/L (ref 15–41)
BILIRUBIN TOTAL: 0.4 mg/dL (ref 0.3–1.2)
BUN: 10 mg/dL (ref 6–20)
CALCIUM: 8.9 mg/dL (ref 8.9–10.3)
CO2: 23 mmol/L (ref 22–32)
Chloride: 104 mmol/L (ref 101–111)
Creatinine, Ser: 0.74 mg/dL (ref 0.44–1.00)
GFR calc Af Amer: >60 mL/min (ref >60)
GLUCOSE: 87 mg/dL (ref 65–99)
Potassium: 4 mmol/L (ref 3.5–5.1)
Sodium: 136 mmol/L (ref 135–145)
TOTAL PROTEIN: 8 g/dL (ref 6.5–8.1)

## 2017-02-21 LAB — I-STAT BETA HCG BLOOD, ED (MC, WL, AP ONLY): I-stat hCG, quantitative: <5 m[IU]/mL (ref <5)

## 2017-02-21 NOTE — ED Triage Notes (Signed)
Pt presents to ED for assessment of constipation today with hard stools and difficulty passing stool after enema and suppository.  Patient also states approx same time she began having left hip pain radiating down the back of her left leg.

## 2017-03-05 ENCOUNTER — Other Ambulatory Visit: Payer: Self-pay | Admitting: Surgery

## 2018-01-25 ENCOUNTER — Other Ambulatory Visit: Payer: Self-pay

## 2018-01-25 ENCOUNTER — Ambulatory Visit (HOSPITAL_COMMUNITY)
Admission: EM | Admit: 2018-01-25 | Discharge: 2018-01-25 | Disposition: A | Discharge status: Home / Self Care | Payer: 59 | Attending: Family Medicine | Admitting: Family Medicine

## 2018-01-25 ENCOUNTER — Encounter (HOSPITAL_COMMUNITY): Payer: Self-pay

## 2018-01-25 DIAGNOSIS — R05 Cough: Secondary | ICD-10-CM | POA: Insufficient documentation

## 2018-01-25 DIAGNOSIS — R059 Cough, unspecified: Secondary | ICD-10-CM

## 2018-01-25 MED ORDER — AZITHROMYCIN 250 MG PO TABS: 250.0000 mg | ORAL_TABLET | Freq: Every day | ORAL | 0 refills | Status: DC

## 2018-01-25 MED ORDER — BENZONATATE 100 MG PO CAPS: 100.0000 mg | ORAL_CAPSULE | Freq: Three times a day (TID) | ORAL | 0 refills | Status: DC

## 2018-01-25 NOTE — ED Triage Notes (Signed)
Pt cc sinus drainage , coughing, fever and headaches . Pt states she has been taking mucinex . X 2 weeks or more.

## 2018-02-23 ENCOUNTER — Other Ambulatory Visit: Payer: Self-pay

## 2018-02-23 ENCOUNTER — Emergency Department (HOSPITAL_COMMUNITY)
Admission: EM | Admit: 2018-02-23 | Discharge: 2018-02-24 | Disposition: A | Discharge status: Home / Self Care | Payer: 59 | Attending: Emergency Medicine | Admitting: Emergency Medicine

## 2018-02-23 ENCOUNTER — Encounter (HOSPITAL_COMMUNITY): Payer: Self-pay | Admitting: Emergency Medicine

## 2018-02-23 DIAGNOSIS — Z79899 Other long term (current) drug therapy: Secondary | ICD-10-CM | POA: Insufficient documentation

## 2018-02-23 DIAGNOSIS — N611 Abscess of the breast and nipple: Secondary | ICD-10-CM | POA: Diagnosis not present

## 2018-02-23 DIAGNOSIS — J45909 Unspecified asthma, uncomplicated: Secondary | ICD-10-CM | POA: Diagnosis not present

## 2018-02-23 DIAGNOSIS — R600 Localized edema: Secondary | ICD-10-CM | POA: Diagnosis present

## 2018-02-23 NOTE — ED Triage Notes (Signed)
Patient complaining of right breast lump that she noticed a week ago. Breast is not painful.

## 2018-02-24 LAB — POC URINE PREG, ED: PREG TEST UR: NEGATIVE

## 2018-02-24 MED ORDER — DOXYCYCLINE HYCLATE 100 MG PO CAPS: 100.0000 mg | ORAL_CAPSULE | Freq: Two times a day (BID) | ORAL | 0 refills | Status: DC

## 2018-02-24 MED ORDER — DOXYCYCLINE HYCLATE 100 MG PO TABS
100.0000 mg | ORAL_TABLET | Freq: Once | ORAL | Status: AC
  Administered 2018-02-24: 100 mg via ORAL
  Filled 2018-02-24: qty 1

## 2018-02-24 MED ORDER — LIDOCAINE-EPINEPHRINE (PF) 2 %-1:200000 IJ SOLN
20.0000 mL | Freq: Once | INTRAMUSCULAR | Status: AC
  Administered 2018-02-24: 20 mL
  Filled 2018-02-24: qty 20

## 2018-02-24 NOTE — ED Notes (Signed)
Pt is expressing concern about her insurance being billed for the pregnancy test. She states that it was not necessary and she does not know why it was performed. [employer] civil engineer spoke with the patient regarding this issue.

## 2018-02-24 NOTE — Discharge Instructions (Signed)
1. Medications: Doxycycline, usual home medications 2. Treatment: rest, drink plenty of fluids, use warm compresses, flush abscess with warm water several times per day 3. Follow Up: Please establish care with the [place] so that they may refer you to the breast center for further imaging as needed.  Please also follow-up with general surgery for further management of your breast abscess; Please return to the ER for fevers, chills, nausea, vomiting or other signs of infection

## 2018-02-24 NOTE — ED Provider Notes (Signed)
[facility]-EMERGENCY DEPT Provider Note   CSN: [id number] Arrival date & time: 02/23/18  2336     History   Chief Complaint Chief Complaint  Patient presents with  . Breast Mass    HPI AVERII Haney is a 27 y.o. female with a hx of current abscesses presents to the Emergency Department complaining of gradual, persistent, progressively worsening right breast swelling and tenderness onset 2-5 days ago.  Patient reports she has had numerous abscesses of her breast however she reports they are almost always where her breast connects to her chest wall.  She reports that they usually open on their own with warm compresses.  She reports that this area is less tender than her other abscesses and despite warm compresses it has not opened.  She denies nipple discharge, inversion, bleeding or changes.  She denies fevers or chills at home.  No nausea or vomiting.  Patient does not have a primary care.  She has required I&D of these abscesses before however she has never required surgery for an abscess.  No history of immunocompromise, diabetes or HIV.  No aggravating or alleviating factors.   HPI  Past Medical History:  Diagnosis Date  . Asthma     Patient Active Problem List   Diagnosis Date Noted  . Impaired glucose tolerance 04/18/2016  . Obesity, Class III, BMI 40-49.9 (morbid obesity) (HCC) 04/18/2016  . Cellulitis of right breast   . Abscess of breast 04/17/2016    Past Surgical History:  Procedure Laterality Date  . CYSTECTOMY    . TONSILLECTOMY       OB History   No obstetric history on file.      Home Medications    Prior to Admission medications   Medication Sig Start Date End Date Taking? Authorizing Provider  azithromycin (ZITHROMAX) 250 MG tablet Take 1 tablet (250 mg total) by mouth daily. Take first 2 tablets together, then 1 every day until finished. 01/25/18   Mardella Layman, MD  bacitracin ointment Apply 1 application 2 (two) times  daily topically. Patient not taking: Reported on 01/25/2018 11/17/16   Everlene Farrier, PA-C  benzonatate (TESSALON) 100 MG capsule Take 1 capsule (100 mg total) by mouth every 8 (eight) hours. 01/25/18   Mardella Layman, MD  clindamycin (CLEOCIN) 300 MG capsule Take 1 capsule (300 mg total) by mouth every 6 (six) hours. Patient not taking: Reported on 01/25/2018 04/20/16   Catarina Hartshorn, MD  doxycycline (VIBRAMYCIN) 100 MG capsule Take 1 capsule (100 mg total) by mouth 2 (two) times daily. 02/24/18   Avacyn Kloosterman, Dahlia Client, PA-C  ibuprofen (ADVIL,MOTRIN) 200 MG tablet Take 600 mg by mouth every 6 (six) hours as needed (pain).    [provider]  naproxen (NAPROSYN) 375 MG tablet Take 1 tablet (375 mg total) 2 (two) times daily with a meal by mouth. Patient not taking: Reported on 01/25/2018 11/17/16   Everlene Farrier, PA-C    Family History Family History  Problem Relation Age of Onset  . Diabetes Other   . Hypertension Other   . Hyperlipidemia Other   . CAD Other     Social History Social History   Tobacco Use  . Smoking status: Never Smoker  . Smokeless tobacco: Never Used  Substance Use Topics  . Alcohol use: No  . Drug use: No     Allergies   Other   Review of Systems Review of Systems  Constitutional: Negative for appetite change, diaphoresis, fatigue, fever and unexpected weight  change.  HENT: Negative for mouth sores.   Eyes: Negative for visual disturbance.  Respiratory: Negative for cough, chest tightness, shortness of breath and wheezing.   Cardiovascular: Negative for chest pain.  Gastrointestinal: Negative for abdominal pain, constipation, diarrhea, nausea and vomiting.  Endocrine: Negative for polydipsia, polyphagia and polyuria.  Genitourinary: Negative for dysuria, frequency, hematuria and urgency.  Musculoskeletal: Negative for back pain and neck stiffness.  Skin: Positive for color change. Negative for rash.  Allergic/Immunologic: Negative for  immunocompromised state.  Neurological: Negative for syncope, light-headedness and headaches.  Hematological: Does not bruise/bleed easily.  Psychiatric/Behavioral: Negative for sleep disturbance. The patient is not nervous/anxious.      Physical Exam Updated Vital Signs BP 130/87 (BP Location: Left Arm)   Pulse 93   Temp 98.9 F (37.2 C) (Oral)   Resp 16   Ht 5' 5.5" (1.664 m)   Wt 122.5 kg   LMP 01/22/2018   SpO2 99%   BMI 44.25 kg/m   Physical Exam Vitals signs and nursing note reviewed.  Constitutional:      General: She is not in acute distress.    Appearance: She is well-developed. She is not diaphoretic.     Comments: Awake, alert, nontoxic appearance  HENT:     Head: Normocephalic and atraumatic.     Mouth/Throat:     Pharynx: No oropharyngeal exudate.  Eyes:     General: No scleral icterus.    Conjunctiva/sclera: Conjunctivae normal.  Neck:     Musculoskeletal: Normal range of motion and neck supple.  Cardiovascular:     Rate and Rhythm: Normal rate and regular rhythm.  Pulmonary:     Effort: Pulmonary effort is normal. No respiratory distress.     Breath sounds: Normal breath sounds. No wheezing.  Chest:     Breasts:        Right: Skin change and tenderness present. No bleeding, inverted nipple, mass or nipple discharge.        Left: No bleeding, inverted nipple, mass, nipple discharge or skin change.    Abdominal:     General: Bowel sounds are normal.     Palpations: Abdomen is soft. There is no mass.     Tenderness: There is no abdominal tenderness. There is no guarding or rebound.  Musculoskeletal: Normal range of motion.  Lymphadenopathy:     Upper Body:     Right upper body: No supraclavicular adenopathy.     Left upper body: No supraclavicular adenopathy.  Skin:    General: Skin is warm and dry.  Neurological:     Mental Status: She is alert.     Comments: Speech is clear and goal oriented Moves extremities without ataxia      ED  Treatments / Results  Labs (all labs ordered are listed, but only abnormal results are displayed) Labs Reviewed  AEROBIC CULTURE (SUPERFICIAL SPECIMEN)  POC URINE PREG, ED     Procedures .Marland Kitchen.Incision and Drainage Date/Time: 02/24/2018 1:40 AM Performed by: Dierdre ForthMuthersbaugh, Lyah Millirons, PA-C Authorized by: Dierdre ForthMuthersbaugh, Makhia Vosler, PA-C   Consent:    Consent obtained:  Verbal   Consent given by:  Patient   Risks discussed:  Bleeding, incomplete drainage, pain and damage to other organs   Alternatives discussed:  No treatment Universal protocol:    Procedure explained and questions answered to patient or proxy's satisfaction: yes     Relevant documents present and verified: yes     Test results available and properly labeled: yes     Imaging studies  available: yes     Required blood products, implants, devices, and special equipment available: yes     Site/side marked: yes     Immediately prior to procedure a time out was called: yes     Patient identity confirmed:  Verbally with patient Location:    Type:  Abscess   Location:  Trunk   Trunk location:  R breast Pre-procedure details:    Skin preparation:  Betadine Anesthesia (see MAR for exact dosages):    Anesthesia method:  Local infiltration   Local anesthetic:  Lidocaine 1% WITH epi Procedure type:    Complexity:  Simple Procedure details:    Needle aspiration: yes     Wound management:  Irrigated with saline   Drainage:  Purulent and bloody   Drainage amount:  Moderate (46mL)   Packing materials:  None Post-procedure details:    Patient tolerance of procedure:  Tolerated well, no immediate complications Comments:     Needle aspiration with ultrasound guidance. Ultrasound ED Soft Tissue Date/Time: 02/24/2018 1:41 AM Performed by: Dierdre Forth, PA-C Authorized by: Dierdre Forth, PA-C   Procedure details:    Indications: localization of abscess and evaluate for cellulitis     Transverse view:  Visualized    Images: archived     Limitations:  Body habitus Location:    Location: breast     Side:  Right Findings:     abscess present    cellulitis present   (including critical care time)  Medications Ordered in ED Medications  lidocaine-EPINEPHrine (XYLOCAINE W/EPI) 2 %-1:200000 (PF) injection 20 mL (20 mLs Infiltration Given by Other 02/24/18 0126)  doxycycline (VIBRA-TABS) tablet 100 mg (100 mg Oral Given 02/24/18 0125)     Initial Impression / Assessment and Plan / ED Course  I have reviewed the triage vital signs and the nursing notes.  Pertinent labs & imaging results that were available during my care of the patient were reviewed by me and considered in my medical decision making (see chart for details).     Patient presents with right breast abscess.  Cellulitis and abscess were localized with ultrasound.  Needle aspiration with approximately 3 mL's of purulent and bloody drainage.  Wound culture sent.  Patient will be started on doxycycline.  Discussed home therapies including warm compresses and close follow-up with general surgery.  Additionally, patient will need to establish care with [facility].  Urine pregnancy ordered at triage is negative.  Discussed home therapies and importance of antibiotic usage with patient.  Also discussed need for close follow-up and reasons to return immediately to the emergency department.  Patient states understanding and is in agreement with the plan.  Final Clinical Impressions(s) / ED Diagnoses   Final diagnoses:  Breast abscess    ED Discharge Orders         Ordered    doxycycline (VIBRAMYCIN) 100 MG capsule  2 times daily     02/24/18 0148           Natanel Snavely, Dahlia Client, PA-C 02/24/18 0150    Ward, Layla Maw, DO 02/24/18 9450

## 2018-02-24 NOTE — ED Notes (Signed)
Pt has a lesion under her right breast. Some redness around the area is noted. Pt does not complain of any pain.

## 2018-02-26 LAB — AEROBIC CULTURE W GRAM STAIN (SUPERFICIAL SPECIMEN)

## 2018-02-26 LAB — AEROBIC CULTURE  (SUPERFICIAL SPECIMEN)

## 2018-02-27 ENCOUNTER — Telehealth: Payer: Self-pay

## 2018-02-27 NOTE — Telephone Encounter (Signed)
Post ED Visit - Positive Culture Follow-up  Culture report reviewed by antimicrobial stewardship pharmacist:  []  Enzo Bi, Pharm.D. []  Celedonio Miyamoto, Pharm.D., BCPS AQ-ID []  Garvin Fila, Pharm.D., BCPS []  Georgina Pillion, Pharm.D., BCPS []  Chandler, [place].D., BCPS, AAHIVP []  Estella Husk, Pharm.D., BCPS, AAHIVP []  Lysle Pearl, PharmD, BCPS []  Phillips Climes, PharmD, BCPS [x]  Agapito Games, PharmD, BCPS []  Verlan Friends, PharmD  Positive Aerobic culture Treated with Doxycycline, organism sensitive to the same and no further patient follow-up is required at this time.  Jerry Caras 02/27/2018, 9:16 AM

## 2018-03-04 ENCOUNTER — Encounter (HOSPITAL_COMMUNITY): Payer: Self-pay | Admitting: Family Medicine

## 2018-03-04 ENCOUNTER — Emergency Department (HOSPITAL_COMMUNITY)
Admission: EM | Admit: 2018-03-04 | Discharge: 2018-03-04 | Disposition: A | Discharge status: Home / Self Care | Payer: 59 | Attending: Emergency Medicine | Admitting: Emergency Medicine

## 2018-03-04 DIAGNOSIS — J45909 Unspecified asthma, uncomplicated: Secondary | ICD-10-CM | POA: Diagnosis not present

## 2018-03-04 DIAGNOSIS — Z79899 Other long term (current) drug therapy: Secondary | ICD-10-CM | POA: Insufficient documentation

## 2018-03-04 DIAGNOSIS — K529 Noninfective gastroenteritis and colitis, unspecified: Secondary | ICD-10-CM | POA: Insufficient documentation

## 2018-03-04 DIAGNOSIS — R1013 Epigastric pain: Secondary | ICD-10-CM | POA: Diagnosis present

## 2018-03-04 LAB — URINALYSIS, ROUTINE W REFLEX MICROSCOPIC
Bilirubin Urine: NEGATIVE
Glucose, UA: NEGATIVE mg/dL
Ketones, ur: NEGATIVE mg/dL
NITRITE: NEGATIVE
PH: 5 (ref 5.0–8.0)
Protein, ur: NEGATIVE mg/dL
SPECIFIC GRAVITY, URINE: 1.025 (ref 1.005–1.030)

## 2018-03-04 LAB — COMPREHENSIVE METABOLIC PANEL
ALBUMIN: 4.1 g/dL (ref 3.5–5.0)
ALT: 24 U/L (ref 0–44)
AST: 19 U/L (ref 15–41)
Alkaline Phosphatase: 52 U/L (ref 38–126)
Anion gap: 8 (ref 5–15)
BUN: 10 mg/dL (ref 6–20)
CHLORIDE: 103 mmol/L (ref 98–111)
CO2: 26 mmol/L (ref 22–32)
Calcium: 8.4 mg/dL — ABNORMAL LOW (ref 8.9–10.3)
Creatinine, Ser: 0.8 mg/dL (ref 0.44–1.00)
GFR calc Af Amer: >60 mL/min (ref >60)
GFR calc non Af Amer: >60 mL/min (ref >60)
Glucose, Bld: 98 mg/dL (ref 70–99)
POTASSIUM: 3.1 mmol/L — AB (ref 3.5–5.1)
Sodium: 137 mmol/L (ref 135–145)
Total Bilirubin: 0.8 mg/dL (ref 0.3–1.2)
Total Protein: 8.1 g/dL (ref 6.5–8.1)

## 2018-03-04 LAB — I-STAT BETA HCG BLOOD, ED (MC, WL, AP ONLY)

## 2018-03-04 LAB — LIPASE, BLOOD: Lipase: 26 U/L (ref 11–51)

## 2018-03-04 LAB — CBC
HCT: 40.8 % (ref 36.0–46.0)
HEMOGLOBIN: 12.5 g/dL (ref 12.0–15.0)
MCH: 26.8 pg (ref 26.0–34.0)
MCHC: 30.6 g/dL (ref 30.0–36.0)
MCV: 87.4 fL (ref 80.0–100.0)
PLATELETS: 353 10*3/uL (ref 150–400)
RBC: 4.67 MIL/uL (ref 3.87–5.11)
RDW: 14.6 % (ref 11.5–15.5)
WBC: 6.1 10*3/uL (ref 4.0–10.5)
nRBC: 0 % (ref 0.0–0.2)

## 2018-03-04 MED ORDER — POTASSIUM CHLORIDE CRYS ER 20 MEQ PO TBCR
40.0000 meq | EXTENDED_RELEASE_TABLET | Freq: Once | ORAL | Status: AC
  Administered 2018-03-04: 40 meq via ORAL
  Filled 2018-03-04: qty 2

## 2018-03-04 MED ORDER — SODIUM CHLORIDE 0.9% FLUSH: 3.0000 mL | Freq: Once | INTRAVENOUS | Status: DC

## 2018-03-04 MED ORDER — ONDANSETRON 8 MG PO TBDP: 8.0000 mg | ORAL_TABLET | Freq: Three times a day (TID) | ORAL | 0 refills | Status: DC | PRN

## 2018-03-04 NOTE — ED Provider Notes (Signed)
[facility]-EMERGENCY DEPT Provider Note   CSN: [id number] Arrival date & time: 03/04/18  1748    History   Chief Complaint Chief Complaint  Patient presents with  . Abdominal Pain  . Headache  . Recurrent Skin Infections    HPI Carol Haney is a 28 y.o. female.     28 year old female presents with sudden onset of emesis and diarrhea after possible bad food exposure.  States that she had nonbilious and bloody emesis followed watery diarrhea.  Is been self-medicating with antiemetics as well as antidiarrheals.  Has had some epigastric discomfort which is exacerbated by burping.  Denies any anginal or CHF type symptoms.  Mild frontal headache without nuchal rigidity or photophobia.  No fever or chills.  Is able to take down Sprite at this time.     Past Medical History:  Diagnosis Date  . Asthma     Patient Active Problem List   Diagnosis Date Noted  . Impaired glucose tolerance 04/18/2016  . Obesity, Class III, BMI 40-49.9 (morbid obesity) (HCC) 04/18/2016  . Cellulitis of right breast   . Abscess of breast 04/17/2016    Past Surgical History:  Procedure Laterality Date  . CYSTECTOMY    . TONSILLECTOMY       OB History   No obstetric history on file.      Home Medications    Prior to Admission medications   Medication Sig Start Date End Date Taking? Authorizing Provider  azithromycin (ZITHROMAX) 250 MG tablet Take 1 tablet (250 mg total) by mouth daily. Take first 2 tablets together, then 1 every day until finished. 01/25/18   Mardella LaymanHagler, Brian, MD  bacitracin ointment Apply 1 application 2 (two) times daily topically. Patient not taking: Reported on 01/25/2018 11/17/16   Everlene Farrieransie, William, PA-C  benzonatate (TESSALON) 100 MG capsule Take 1 capsule (100 mg total) by mouth every 8 (eight) hours. 01/25/18   Mardella LaymanHagler, Brian, MD  clindamycin (CLEOCIN) 300 MG capsule Take 1 capsule (300 mg total) by mouth every 6 (six) hours. Patient not taking:  Reported on 01/25/2018 04/20/16   Catarina Hartshornat, David, MD  doxycycline (VIBRAMYCIN) 100 MG capsule Take 1 capsule (100 mg total) by mouth 2 (two) times daily. 02/24/18   Muthersbaugh, Dahlia ClientHannah, PA-C  ibuprofen (ADVIL,MOTRIN) 200 MG tablet Take 600 mg by mouth every 6 (six) hours as needed (pain).    [provider]  naproxen (NAPROSYN) 375 MG tablet Take 1 tablet (375 mg total) 2 (two) times daily with a meal by mouth. Patient not taking: Reported on 01/25/2018 11/17/16   Everlene Farrieransie, William, PA-C    Family History Family History  Problem Relation Age of Onset  . Diabetes Other   . Hypertension Other   . Hyperlipidemia Other   . CAD Other     Social History Social History   Tobacco Use  . Smoking status: Never Smoker  . Smokeless tobacco: Never Used  Substance Use Topics  . Alcohol use: No  . Drug use: No     Allergies   Other   Review of Systems Review of Systems  All other systems reviewed and are negative.    Physical Exam Updated Vital Signs BP (!) 148/94 (BP Location: Right Arm)   Pulse 87   Temp 99.3 F (37.4 C) (Oral)   Resp 13   Ht 1.664 m (5' 5.5")   Wt 122.5 kg   LMP 02/27/2018   SpO2 100%   BMI 44.25 kg/m   Physical Exam Vitals  signs and nursing note reviewed.  Constitutional:      General: She is not in acute distress.    Appearance: Normal appearance. She is well-developed. She is not toxic-appearing.  HENT:     Head: Normocephalic and atraumatic.  Eyes:     General: Lids are normal.     Conjunctiva/sclera: Conjunctivae normal.     Pupils: Pupils are equal, round, and reactive to light.  Neck:     Musculoskeletal: Normal range of motion and neck supple.     Thyroid: No thyroid mass.     Trachea: No tracheal deviation.  Cardiovascular:     Rate and Rhythm: Normal rate and regular rhythm.     Heart sounds: Normal heart sounds. No murmur. No gallop.   Pulmonary:     Effort: Pulmonary effort is normal. No respiratory distress.     Breath sounds:  Normal breath sounds. No stridor. No decreased breath sounds, wheezing, rhonchi or rales.  Abdominal:     General: Bowel sounds are normal. There is no distension.     Palpations: Abdomen is soft.     Tenderness: There is abdominal tenderness in the epigastric area. There is no rebound.    Musculoskeletal: Normal range of motion.        General: No tenderness.  Skin:    General: Skin is warm and dry.     Findings: No abrasion or rash.  Neurological:     Mental Status: She is alert and oriented to person, place, and time.     GCS: GCS eye subscore is 4. GCS verbal subscore is 5. GCS motor subscore is 6.     Cranial Nerves: No cranial nerve deficit.     Sensory: No sensory deficit.  Psychiatric:        Speech: Speech normal.        Behavior: Behavior normal.      ED Treatments / Results  Labs (all labs ordered are listed, but only abnormal results are displayed) Labs Reviewed  COMPREHENSIVE METABOLIC PANEL - Abnormal; Notable for the following components:      Result Value   Potassium 3.1 (*)    Calcium 8.4 (*)    All other components within normal limits  URINALYSIS, ROUTINE W REFLEX MICROSCOPIC - Abnormal; Notable for the following components:   APPearance HAZY (*)    Hgb urine dipstick LARGE (*)    Leukocytes,Ua SMALL (*)    Bacteria, UA RARE (*)    All other components within normal limits  LIPASE, BLOOD  CBC  I-STAT BETA HCG BLOOD, ED ([facility], [facility], [facility] ONLY)    EKG None  Radiology No results found.  Procedures Procedures (including critical care time)  Medications Ordered in ED Medications  sodium chloride flush (NS) 0.9 % injection 3 mL (has no administration in time range)  potassium chloride SA (K-DUR,KLOR-CON) CR tablet 40 mEq (has no administration in time range)     Initial Impression / Assessment and Plan / ED Course  I have reviewed the triage vital signs and the nursing notes.  Pertinent labs & imaging results that were available during my care of  the patient were reviewed by me and considered in my medical decision making (see chart for details).       Patient with mild hypokalemia here and she is oral potassium.  Suspect food poisoning.  Offered patient IV fluids which she has declined.  Will prescribe antiemetics and return precautions  Final Clinical Impressions(s) / ED Diagnoses  Final diagnoses:  None    ED Discharge Orders    None       Lorre Nick, MD 03/04/18 2119

## 2018-03-04 NOTE — ED Triage Notes (Signed)
Patient is complaining of epigastric pain with nausea and vomiting, headache, and concerned about a "boil" on the right breast. Patient believes her symptoms are related to food poisoning because she became sick after eating [employer]. Patient has took [employer] and Imodium AD for diarrhea.

## 2019-08-17 ENCOUNTER — Other Ambulatory Visit: Payer: Self-pay | Admitting: Surgery

## 2019-12-31 ENCOUNTER — Other Ambulatory Visit: Payer: Self-pay

## 2019-12-31 ENCOUNTER — Ambulatory Visit: Payer: 59 | Attending: Critical Care Medicine

## 2019-12-31 DIAGNOSIS — Z23 Encounter for immunization: Secondary | ICD-10-CM

## 2019-12-31 NOTE — Progress Notes (Signed)
   Covid-19 Vaccination Clinic  Name:  Carol Haney    MRN: [id number] DOB: Oct 26, 1990  12/31/2019  Carol Haney was observed post Covid-19 immunization for 15 minutes without incident. She was provided with Vaccine Information Sheet and instruction to access the V-Safe system.   Carol Haney was instructed to call 911 with any severe reactions post vaccine: Marland Kitchen Difficulty breathing  . Swelling of face and throat  . A fast heartbeat  . A bad rash all over body  . Dizziness and weakness   Immunizations Administered    Name Date Dose VIS Date Route   Pfizer COVID-19 Vaccine 12/31/2019  5:36 PM 0.3 mL 10/28/2019 Intramuscular   Manufacturer: [employer], [employer]   Lot: [id number]   NDC: [address]-9

## 2020-09-01 ENCOUNTER — Emergency Department (HOSPITAL_BASED_OUTPATIENT_CLINIC_OR_DEPARTMENT_OTHER)
Admission: EM | Admit: 2020-09-01 | Discharge: 2020-09-02 | Disposition: A | Discharge status: Home / Self Care | Payer: 59 | Attending: Emergency Medicine | Admitting: Emergency Medicine

## 2020-09-01 ENCOUNTER — Other Ambulatory Visit: Payer: Self-pay

## 2020-09-01 ENCOUNTER — Encounter (HOSPITAL_BASED_OUTPATIENT_CLINIC_OR_DEPARTMENT_OTHER): Payer: Self-pay

## 2020-09-01 DIAGNOSIS — J45909 Unspecified asthma, uncomplicated: Secondary | ICD-10-CM | POA: Diagnosis not present

## 2020-09-01 DIAGNOSIS — U071 COVID-19: Secondary | ICD-10-CM | POA: Insufficient documentation

## 2020-09-01 DIAGNOSIS — R519 Headache, unspecified: Secondary | ICD-10-CM | POA: Diagnosis present

## 2020-09-01 DIAGNOSIS — R509 Fever, unspecified: Secondary | ICD-10-CM

## 2020-09-01 DIAGNOSIS — J00 Acute nasopharyngitis [common cold]: Secondary | ICD-10-CM

## 2020-09-01 MED ORDER — ACETAMINOPHEN 500 MG PO TABS
1000.0000 mg | ORAL_TABLET | Freq: Once | ORAL | Status: AC
  Administered 2020-09-02: 1000 mg via ORAL
  Filled 2020-09-01: qty 2

## 2020-09-01 NOTE — ED Triage Notes (Signed)
Reports woke up Wednesday with a scratchy throat and headache and vomiting.  Reports 2 neg covid home test.

## 2020-09-01 NOTE — ED Provider Notes (Signed)
[facility] EMERGENCY DEPT Provider Note  CSN: [id number] Arrival date & time: 09/01/20 2308  Chief Complaint(s) Headache (N/v blurred vision)  HPI Carol Haney is a 30 y.o. female    Headache Pain location:  Frontal Quality:  Dull Onset quality:  Gradual Duration:  2 days Timing:  Constant Progression:  Worsening Chronicity:  New Context: coughing   Relieved by:  Nothing Worsened by:  Nothing Associated symptoms: congestion, cough, fever, sore throat and URI   Associated symptoms: no blurred vision, no neck pain, no neck stiffness and no photophobia    Past Medical History Past Medical History:  Diagnosis Date   Asthma    Patient Active Problem List   Diagnosis Date Noted   Impaired glucose tolerance 04/18/2016   Obesity, Class III, BMI 40-49.9 (morbid obesity) (HCC) 04/18/2016   Cellulitis of right breast    Abscess of breast 04/17/2016   Home Medication(s) Prior to Admission medications   Medication Sig Start Date End Date Taking? Authorizing Provider  azithromycin (ZITHROMAX) 250 MG tablet Take 1 tablet (250 mg total) by mouth daily. Take first 2 tablets together, then 1 every day until finished. 01/25/18   Mardella Layman, MD  bacitracin ointment Apply 1 application 2 (two) times daily topically. Patient not taking: Reported on 01/25/2018 11/17/16   Everlene Farrier, PA-C  benzonatate (TESSALON) 100 MG capsule Take 1 capsule (100 mg total) by mouth every 8 (eight) hours. 01/25/18   Mardella Layman, MD  clindamycin (CLEOCIN) 300 MG capsule Take 1 capsule (300 mg total) by mouth every 6 (six) hours. Patient not taking: Reported on 01/25/2018 04/20/16   Catarina Hartshorn, MD  doxycycline (VIBRAMYCIN) 100 MG capsule Take 1 capsule (100 mg total) by mouth 2 (two) times daily. 02/24/18   Muthersbaugh, Dahlia Client, PA-C  ibuprofen (ADVIL,MOTRIN) 200 MG tablet Take 600 mg by mouth every 6 (six) hours as needed (pain).    [provider]  naproxen (NAPROSYN) 375 MG  tablet Take 1 tablet (375 mg total) 2 (two) times daily with a meal by mouth. Patient not taking: Reported on 01/25/2018 11/17/16   Everlene Farrier, PA-C  ondansetron (ZOFRAN ODT) 8 MG disintegrating tablet Take 1 tablet (8 mg total) by mouth every 8 (eight) hours as needed for nausea or vomiting. 03/04/18   Lorre Nick, MD                                                                                                                                    Past Surgical History Past Surgical History:  Procedure Laterality Date   CYSTECTOMY     TONSILLECTOMY     Family History Family History  Problem Relation Age of Onset   Diabetes Other    Hypertension Other    Hyperlipidemia Other    CAD Other     Social History Social History   Tobacco Use   Smoking status: Never   Smokeless tobacco: Never  Substance Use Topics   Alcohol use: No   Drug use: No   Allergies Other  Review of Systems Review of Systems  Constitutional:  Positive for fever.  HENT:  Positive for congestion and sore throat.   Eyes:  Negative for blurred vision and photophobia.  Respiratory:  Positive for cough.   Musculoskeletal:  Negative for neck pain and neck stiffness.  Neurological:  Positive for headaches.  All other systems are reviewed and are negative for acute change except as noted in the HPI  Physical Exam Vital Signs  I have reviewed the triage vital signs BP (!) 150/95 (BP Location: Right Arm)   Pulse (!) 115   Temp (!) 101.5 F (38.6 C) (Oral)   Resp 20   Ht 5\' 5"  (1.651 m)   Wt 124.7 kg   SpO2 98%   BMI 45.76 kg/m   Physical Exam Vitals reviewed.  Constitutional:      General: She is not in acute distress.    Appearance: She is well-developed. She is not diaphoretic.  HENT:     Head: Normocephalic and atraumatic.     Nose: Congestion and rhinorrhea present.     Mouth/Throat:     Pharynx: No posterior oropharyngeal erythema.     Tonsils: No tonsillar exudate or tonsillar  abscesses.     Comments: Post nasal drip Eyes:     General: No scleral icterus.       Right eye: No discharge.        Left eye: No discharge.     Conjunctiva/sclera: Conjunctivae normal.     Pupils: Pupils are equal, round, and reactive to light.  Cardiovascular:     Rate and Rhythm: Normal rate and regular rhythm.     Heart sounds: No murmur heard.   No friction rub. No gallop.  Pulmonary:     Effort: Pulmonary effort is normal. No respiratory distress.     Breath sounds: Normal breath sounds. No stridor. No rales.  Abdominal:     General: There is no distension.     Palpations: Abdomen is soft.     Tenderness: There is no abdominal tenderness.  Musculoskeletal:        General: No tenderness.     Cervical back: Normal range of motion and neck supple. No rigidity. No pain with movement.  Skin:    General: Skin is warm and dry.     Findings: No erythema or rash.  Neurological:     Mental Status: She is alert and oriented to person, place, and time.    ED Results and Treatments Labs (all labs ordered are listed, but only abnormal results are displayed) Labs Reviewed  RESP PANEL BY RT-PCR (FLU A&B, COVID) [id number]                                                                                                                         EKG  EKG Interpretation  Date/Time:    Ventricular Rate:  PR Interval:    QRS Duration:   QT Interval:    QTC Calculation:   R Axis:     Text Interpretation:         Radiology No results found.  Pertinent labs & imaging results that were available during my care of the patient were reviewed by me and considered in my medical decision making (see MDM for details).  Medications Ordered in ED Medications  acetaminophen (TYLENOL) tablet 1,000 mg (1,000 mg Oral Given 09/02/20 0005)                                                                                                                                      Procedures Procedures  (including critical care time)  Medical Decision Making / ED Course I have reviewed the nursing notes for this encounter and the patient's prior records (if available in EHR or on provided paperwork).  Carol Haney was evaluated in Emergency Department on 09/02/2020 for the symptoms described in the history of present illness. She was evaluated in the context of the global COVID-19 pandemic, which necessitated consideration that the patient might be at risk for infection with the SARS-CoV-2 virus that causes COVID-19. Institutional protocols and algorithms that pertain to the evaluation of patients at risk for COVID-19 are in a state of rapid change based on information released by regulatory bodies including the CDC and federal and state organizations. These policies and algorithms were followed during the patient's care in the ED.     30 y.o. female presents with sinus pain, nasal congestion, rhinorrhea, cough, and fever for 2 days. Adequate oral hydration. Rest of history as above.  Patient appears well. No signs of toxicity, patient is interactive and playful. No hypoxia, tachypnea or other signs of respiratory distress. No sign of clinical dehydration. Lung exam Clear. Rest of exam as above.  Most consistent with viral upper respiratory infection.   No evidence suggestive of pharyngitis, AOM, PNA, or meningitis.   Chest x-ray not indicated at this time.  COVID/Flu sent. MyChart follow up for results  Discussed symptomatic treatment with the parents and they will follow closely with their PCP.     Final Clinical Impression(s) / ED Diagnoses Final diagnoses:  Fever in adult  Acute nasopharyngitis   The patient appears reasonably screened and/or stabilized for discharge and I doubt any other medical condition or other [facility] requiring further screening, evaluation, or treatment in the ED at this time prior to discharge. Safe for discharge with strict  return precautions.  Disposition: Discharge  Condition: Good  I have discussed the results, Dx and Tx plan with the patient/family who expressed understanding and agree(s) with the plan. Discharge instructions discussed at length. The patient/family was given strict return precautions who verbalized understanding of the instructions. No further questions at time of discharge.    ED Discharge Orders     None  Follow Up: Primary care provider  Schedule an appointment as soon as possible for a visit  if you do not have a primary care physician, contact [facility] at [phone] for referral    This chart was dictated using voice recognition software.  Despite best efforts to proofread,  errors can occur which can change the documentation meaning.    Nira Conn, MD 09/02/20 [phone]

## 2020-09-02 LAB — RESP PANEL BY RT-PCR (FLU A&B, COVID) ARPGX2
Influenza A by PCR: NEGATIVE
Influenza B by PCR: NEGATIVE
SARS Coronavirus 2 by RT PCR: POSITIVE — AB

## 2020-09-02 NOTE — Discharge Instructions (Addendum)
You may take over-the-counter medicine for symptomatic relief, such as Tylenol, Motrin, TheraFlu, Alka seltzer , black elderberry, etc. Please limit acetaminophen (Tylenol) to 4000 mg and Ibuprofen (Motrin, Advil, etc.) to 2400 mg for a 24hr period. Please note that other over-the-counter medicine may contain acetaminophen or ibuprofen as a component of their ingredients.   

## 2020-09-02 NOTE — ED Notes (Signed)
Provided patient gatorade for hydration.

## 2020-09-04 ENCOUNTER — Emergency Department (HOSPITAL_BASED_OUTPATIENT_CLINIC_OR_DEPARTMENT_OTHER)
Admission: EM | Admit: 2020-09-04 | Discharge: 2020-09-04 | Disposition: A | Discharge status: Home / Self Care | Payer: 59 | Attending: Emergency Medicine | Admitting: Emergency Medicine

## 2020-09-04 ENCOUNTER — Other Ambulatory Visit: Payer: Self-pay

## 2020-09-04 ENCOUNTER — Encounter (HOSPITAL_BASED_OUTPATIENT_CLINIC_OR_DEPARTMENT_OTHER): Payer: Self-pay | Admitting: *Deleted

## 2020-09-04 DIAGNOSIS — J45909 Unspecified asthma, uncomplicated: Secondary | ICD-10-CM | POA: Diagnosis not present

## 2020-09-04 DIAGNOSIS — U071 COVID-19: Secondary | ICD-10-CM | POA: Insufficient documentation

## 2020-09-04 DIAGNOSIS — Z7951 Long term (current) use of inhaled steroids: Secondary | ICD-10-CM | POA: Diagnosis not present

## 2020-09-04 DIAGNOSIS — Z6841 Body Mass Index (BMI) 40.0 and over, adult: Secondary | ICD-10-CM | POA: Insufficient documentation

## 2020-09-04 MED ORDER — AEROCHAMBER PLUS FLO-VU MEDIUM MISC
1.0000 | Freq: Once | Status: AC
  Administered 2020-09-04: 1
  Filled 2020-09-04: qty 1

## 2020-09-04 MED ORDER — ALBUTEROL SULFATE HFA 108 (90 BASE) MCG/ACT IN AERS
2.0000 | INHALATION_SPRAY | Freq: Once | RESPIRATORY_TRACT | Status: AC
  Administered 2020-09-04: 2 via RESPIRATORY_TRACT
  Filled 2020-09-04: qty 6.7

## 2020-09-04 MED ORDER — ALBUTEROL SULFATE HFA 108 (90 BASE) MCG/ACT IN AERS: 2.0000 | INHALATION_SPRAY | RESPIRATORY_TRACT | 0 refills | Status: DC | PRN

## 2020-09-04 NOTE — ED Notes (Signed)
Pt verbalizes understanding of discharge instructions. Opportunity for questioning and answers were provided. Armand removed by staff, pt discharged from ED to home. Educated to on albuterol.

## 2020-09-04 NOTE — ED Triage Notes (Signed)
Pt c/o feeling sob after covid diagnosis. Pt states hx of asthma. States she has had fever on and off. No wheezing noted at present. Also c/o feeling congested. Walking sat 99% on RA.

## 2020-09-04 NOTE — ED Notes (Signed)
RT educated pt on proper use of MDI w/aero chamber for administration of medication. Pt able to perform w/out difficulty. Pt expressed understanding of medication administration information w/teach back to RT.

## 2020-09-04 NOTE — ED Provider Notes (Signed)
[facility] EMERGENCY DEPT Provider Note   CSN: [id number] Arrival date & time: 09/04/20  0146     History Chief Complaint  Patient presents with   Shortness of Breath    Carol Haney is a 30 y.o. female.  The history is provided by the patient.  Shortness of Breath Severity:  Moderate Onset quality:  Gradual Duration:  3 days Timing:  Constant Progression:  Unchanged Chronicity:  New Context: URI   Context: not pollens   Relieved by:  Nothing Worsened by:  Nothing Ineffective treatments:  None tried Associated symptoms: no abdominal pain, no chest pain, no diaphoresis, no fever, no PND, no rash, no sputum production, no swollen glands and no wheezing   Associated symptoms comment:  Nasal congestion  Risk factors: no tobacco use   COVID 19 diagnosis 2 days ago, nasal congestion and feels SOB.  Fevers are gone.  She is not taking anything for her symptoms.      Past Medical History:  Diagnosis Date   Asthma     Patient Active Problem List   Diagnosis Date Noted   Impaired glucose tolerance 04/18/2016   Obesity, Class III, BMI 40-49.9 (morbid obesity) (HCC) 04/18/2016   Cellulitis of right breast    Abscess of breast 04/17/2016    Past Surgical History:  Procedure Laterality Date   CYSTECTOMY     TONSILLECTOMY       OB History   No obstetric history on file.     Family History  Problem Relation Age of Onset   Diabetes Other    Hypertension Other    Hyperlipidemia Other    CAD Other     Social History   Tobacco Use   Smoking status: Never   Smokeless tobacco: Never  Substance Use Topics   Alcohol use: No   Drug use: No    Home Medications Prior to Admission medications   Medication Sig Start Date End Date Taking? Authorizing Provider  albuterol (VENTOLIN HFA) 108 (90 Base) MCG/ACT inhaler Inhale 2 puffs into the lungs every 4 (four) hours as needed for wheezing or shortness of breath. 09/04/20  Yes Navneet Schmuck, MD   bacitracin ointment Apply 1 application 2 (two) times daily topically. Patient not taking: Reported on 01/25/2018 11/17/16   Everlene Farrier, PA-C  clindamycin (CLEOCIN) 300 MG capsule Take 1 capsule (300 mg total) by mouth every 6 (six) hours. Patient not taking: Reported on 01/25/2018 04/20/16   Catarina Hartshorn, MD    Allergies    Other  Review of Systems   Review of Systems  Constitutional:  Negative for diaphoresis and fever.  HENT:  Positive for congestion.   Eyes:  Negative for redness.  Respiratory:  Positive for shortness of breath. Negative for sputum production and wheezing.   Cardiovascular:  Negative for chest pain and PND.  Gastrointestinal:  Negative for abdominal pain.  Genitourinary:  Negative for difficulty urinating.  Musculoskeletal:  Negative for neck stiffness.  Skin:  Negative for rash.  Neurological:  Negative for facial asymmetry.  Psychiatric/Behavioral:  Negative for agitation.   All other systems reviewed and are negative.  Physical Exam Updated Vital Signs BP 132/85   Pulse 73   Temp 98.2 F (36.8 C) (Oral)   Resp 20   Ht 5\' 5"  (1.651 m)   Wt 124.7 kg   LMP 08/04/2020   SpO2 99%   BMI 45.76 kg/m   Physical Exam Vitals and nursing note reviewed.  Constitutional:  General: She is not in acute distress.    Appearance: Normal appearance.  HENT:     Head: Normocephalic and atraumatic.     Nose: Nose normal.  Eyes:     Conjunctiva/sclera: Conjunctivae normal.     Pupils: Pupils are equal, round, and reactive to light.  Cardiovascular:     Rate and Rhythm: Normal rate and regular rhythm.     Pulses: Normal pulses.     Heart sounds: Normal heart sounds.  Pulmonary:     Effort: Pulmonary effort is normal. No respiratory distress.     Breath sounds: Normal breath sounds. No wheezing or rales.  Abdominal:     General: Abdomen is flat. Bowel sounds are normal.     Palpations: Abdomen is soft.     Tenderness: There is no abdominal tenderness.  There is no guarding.  Musculoskeletal:        General: Normal range of motion.     Cervical back: Normal range of motion and neck supple.  Skin:    General: Skin is warm and dry.     Capillary Refill: Capillary refill takes less than 2 seconds.  Neurological:     General: No focal deficit present.     Mental Status: She is alert and oriented to person, place, and time.     Deep Tendon Reflexes: Reflexes normal.  Psychiatric:        Mood and Affect: Mood normal.        Behavior: Behavior normal.    ED Results / Procedures / Treatments   Labs (all labs ordered are listed, but only abnormal results are displayed) Labs Reviewed - No data to display  EKG None  Radiology No results found.  Procedures Procedures   Medications Ordered in ED Medications  albuterol (VENTOLIN HFA) 108 (90 Base) MCG/ACT inhaler 2 puff (has no administration in time range)  AeroChamber Plus Flo-Vu Medium MISC 1 each (has no administration in time range)    ED Course  I have reviewed the triage vital signs and the nursing notes.  Pertinent labs & imaging results that were available during my care of the patient were reviewed by me and considered in my medical decision making (see chart for details).   Walking O2 saturation is 98%.  Refilled asthma medication while here.  Saturation 99-100%.  Lungs are clear.  No tachycardia.  Wells score 0, I do not believe this is PE.  Stable for discharge with close follow up.  Try sleeping on stomach and taking nasal decongestants.     Carol Haney was evaluated in Emergency Department on 09/04/2020 for the symptoms described in the history of present illness. She was evaluated in the context of the global COVID-19 pandemic, which necessitated consideration that the patient might be at risk for infection with the SARS-CoV-2 virus that causes COVID-19. Institutional protocols and algorithms that pertain to the evaluation of patients at risk for COVID-19 are in a  state of rapid change based on information released by regulatory bodies including the CDC and federal and state organizations. These policies and algorithms were followed during the patient's care in the ED.  Final Clinical Impression(s) / ED Diagnoses Final diagnoses:  COVID-19  Return for intractable cough, coughing up blood, fevers > 100.4 unrelieved by medication, shortness of breath, intractable vomiting, chest pain, shortness of breath, weakness, numbness, changes in speech, facial asymmetry, abdominal pain, passing out, Inability to tolerate liquids or food, cough, altered mental status or any concerns. No signs  of systemic illness or infection. The patient is nontoxic-appearing on exam and vital signs are within normal limits. I have reviewed the triage vital signs and the nursing notes. Pertinent labs & imaging results that were available during my care of the patient were reviewed by me and considered in my medical decision making (see chart for details). After history, exam, and medical workup I feel the patient has been appropriately medically screened and is safe for discharge home. Pertinent diagnoses were discussed with the patient. Patient was given return precautions.      Rx / DC Orders ED Discharge Orders          Ordered    albuterol (VENTOLIN HFA) 108 (90 Base) MCG/ACT inhaler  Every 4 hours PRN        09/04/20 0206             Jailene Cupit, MD 09/04/20 0626

## 2020-11-07 DIAGNOSIS — M25561 Pain in right knee: Secondary | ICD-10-CM | POA: Diagnosis present

## 2020-11-07 DIAGNOSIS — M79604 Pain in right leg: Secondary | ICD-10-CM | POA: Insufficient documentation

## 2020-11-07 DIAGNOSIS — J45909 Unspecified asthma, uncomplicated: Secondary | ICD-10-CM | POA: Insufficient documentation

## 2020-11-08 ENCOUNTER — Encounter (HOSPITAL_BASED_OUTPATIENT_CLINIC_OR_DEPARTMENT_OTHER): Payer: Self-pay

## 2020-11-08 ENCOUNTER — Emergency Department (HOSPITAL_BASED_OUTPATIENT_CLINIC_OR_DEPARTMENT_OTHER): Payer: 59

## 2020-11-08 ENCOUNTER — Emergency Department (HOSPITAL_BASED_OUTPATIENT_CLINIC_OR_DEPARTMENT_OTHER)
Admission: EM | Admit: 2020-11-08 | Discharge: 2020-11-08 | Disposition: A | Discharge status: Home / Self Care | Payer: 59 | Attending: Emergency Medicine | Admitting: Emergency Medicine

## 2020-11-08 DIAGNOSIS — M79606 Pain in leg, unspecified: Secondary | ICD-10-CM

## 2020-11-08 DIAGNOSIS — M25569 Pain in unspecified knee: Secondary | ICD-10-CM

## 2020-11-08 MED ORDER — ACETAMINOPHEN 500 MG PO TABS
1000.0000 mg | ORAL_TABLET | Freq: Once | ORAL | Status: AC
  Administered 2020-11-08: 1000 mg via ORAL
  Filled 2020-11-08: qty 2

## 2020-11-08 MED ORDER — LIDOCAINE 5 % EX PTCH
3.0000 | MEDICATED_PATCH | CUTANEOUS | Status: DC
  Administered 2020-11-08: 3 via TRANSDERMAL
  Filled 2020-11-08: qty 3

## 2020-11-08 MED ORDER — LIDOCAINE 5 % EX PTCH: 1.0000 | MEDICATED_PATCH | CUTANEOUS | 0 refills | Status: DC

## 2020-11-08 MED ORDER — NAPROXEN 250 MG PO TABS
500.0000 mg | ORAL_TABLET | Freq: Once | ORAL | Status: AC
  Administered 2020-11-08: 500 mg via ORAL
  Filled 2020-11-08: qty 2

## 2020-11-08 MED ORDER — MELOXICAM 15 MG PO TABS: 15.0000 mg | ORAL_TABLET | Freq: Every day | ORAL | 0 refills | Status: DC

## 2020-11-08 NOTE — ED Triage Notes (Signed)
Patient arrives from home with c/o right knee/leg pain. Patient states it has been hurting x1 week, yet it worsened on Saturday when she was cleaning her tub.

## 2020-11-08 NOTE — ED Notes (Signed)
Lidocaine patches applied to R knee, R hip, lumbar spine

## 2020-11-08 NOTE — ED Provider Notes (Signed)
[facility] EMERGENCY DEPT Provider Note   CSN: [id number] Arrival date & time: 11/07/20  2355     History Chief Complaint  Patient presents with   Knee Pain   Leg Pain    Carol Haney is a 30 y.o. female.  The history is provided by the patient.  Knee Pain Location:  Knee Time since incident:  7 days Injury: no   Knee location:  R knee Pain details:    Quality:  Aching   Radiates to:  Does not radiate   Severity:  Moderate   Onset quality:  Gradual   Duration:  7 days   Timing:  Constant   Progression:  Unchanged Chronicity:  New Dislocation: no   Foreign body present:  No foreign bodies Prior injury to area:  No Relieved by:  Nothing Worsened by:  Nothing Ineffective treatments:  Acetaminophen Associated symptoms comment:  No OCP, no calf pain, no swelling.  No travel, works on the leg and walks a lot.   Risk factors: no concern for non-accidental trauma       Past Medical History:  Diagnosis Date   Asthma     Patient Active Problem List   Diagnosis Date Noted   Impaired glucose tolerance 04/18/2016   Obesity, Class III, BMI 40-49.9 (morbid obesity) (HCC) 04/18/2016   Cellulitis of right breast    Abscess of breast 04/17/2016    Past Surgical History:  Procedure Laterality Date   CYSTECTOMY     TONSILLECTOMY       OB History   No obstetric history on file.     Family History  Problem Relation Age of Onset   Diabetes Other    Hypertension Other    Hyperlipidemia Other    CAD Other     Social History   Tobacco Use   Smoking status: Never   Smokeless tobacco: Never  Substance Use Topics   Alcohol use: No   Drug use: No    Home Medications Prior to Admission medications   Medication Sig Start Date End Date Taking? Authorizing Provider  albuterol (VENTOLIN HFA) 108 (90 Base) MCG/ACT inhaler Inhale 2 puffs into the lungs every 4 (four) hours as needed for wheezing or shortness of breath. 09/04/20   Maurilio Puryear,  MD  bacitracin ointment Apply 1 application 2 (two) times daily topically. Patient not taking: Reported on 01/25/2018 11/17/16   Everlene Farrier, PA-C  clindamycin (CLEOCIN) 300 MG capsule Take 1 capsule (300 mg total) by mouth every 6 (six) hours. Patient not taking: Reported on 01/25/2018 04/20/16   Catarina Hartshorn, MD    Allergies    Other  Review of Systems   Review of Systems  HENT:  Negative for facial swelling.   Eyes:  Negative for redness.  Respiratory:  Negative for shortness of breath.   Cardiovascular:  Negative for chest pain, palpitations and leg swelling.  Gastrointestinal:  Negative for vomiting.  Genitourinary:  Negative for difficulty urinating.  Musculoskeletal:  Positive for arthralgias. Negative for neck stiffness.  Skin:  Negative for rash.  Neurological:  Negative for facial asymmetry.  Psychiatric/Behavioral:  Negative for agitation.   All other systems reviewed and are negative.  Physical Exam Updated Vital Signs BP 132/83   Pulse 79   Temp 98.4 F (36.9 C)   Resp 18   Ht 5\' 5"  (1.651 m)   Wt 122.5 kg   LMP 08/29/2020 (Approximate)   SpO2 99%   BMI 44.93 kg/m   Physical Exam  Vitals and nursing note reviewed.  Constitutional:      General: She is not in acute distress.    Appearance: Normal appearance.  HENT:     Head: Normocephalic and atraumatic.     Nose: Nose normal.  Eyes:     Conjunctiva/sclera: Conjunctivae normal.     Pupils: Pupils are equal, round, and reactive to light.  Cardiovascular:     Rate and Rhythm: Normal rate and regular rhythm.     Pulses: Normal pulses.     Heart sounds: Normal heart sounds.  Pulmonary:     Effort: Pulmonary effort is normal.     Breath sounds: Normal breath sounds.  Abdominal:     General: Abdomen is flat. Bowel sounds are normal.     Palpations: Abdomen is soft.     Tenderness: There is no abdominal tenderness. There is no guarding.  Musculoskeletal:        General: Normal range of motion.      Cervical back: Normal range of motion and neck supple.     Right knee: Normal. No swelling, deformity, effusion, erythema, ecchymosis or bony tenderness. No tenderness. No MCL laxity, ACL laxity or PCL laxity. Normal patellar mobility. Normal pulse.     Instability Tests: Anterior drawer test negative. Posterior drawer test negative.     Right lower leg: Normal.     Right ankle: Normal.     Right Achilles Tendon: Normal.     Right foot: Normal capillary refill.  Skin:    General: Skin is warm and dry.     Capillary Refill: Capillary refill takes less than 2 seconds.  Neurological:     General: No focal deficit present.     Mental Status: She is alert and oriented to person, place, and time.     Deep Tendon Reflexes: Reflexes normal.  Psychiatric:        Mood and Affect: Mood normal.        Behavior: Behavior normal.    ED Results / Procedures / Treatments   Labs (all labs ordered are listed, but only abnormal results are displayed) Labs Reviewed - No data to display  EKG None  Radiology DG Knee Right Port  Result Date: 11/08/2020 CLINICAL DATA:  Right knee pain EXAM: PORTABLE RIGHT KNEE - 1-2 VIEW COMPARISON:  None. FINDINGS: Normal alignment. No acute fracture or dislocation. 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 No effusion. Soft tissues are unremarkable. IMPRESSION: Mild bicompartmental degenerative arthritis. Electronically Signed   By: Helyn Numbers M.D.   On: 11/08/2020 00:43   DG Tibia/Fibula Right Port  Result Date: 11/08/2020 CLINICAL DATA:  Right leg pain EXAM: PORTABLE RIGHT TIBIA AND FIBULA - 2 VIEW COMPARISON:  None. FINDINGS: There is no evidence of fracture or other focal bone lesions. Soft tissues are unremarkable. IMPRESSION: Negative. Electronically Signed   By: Helyn Numbers M.D.   On: 11/08/2020 00:42    Procedures Procedures   Medications Ordered in ED Medications  acetaminophen (TYLENOL) tablet 1,000  mg (has no administration in time range)  lidocaine (LIDODERM) 5 % 3 patch (has no administration in time range)  naproxen (NAPROSYN) tablet 500 mg (has no administration in time range)    ED Course  I have reviewed the triage vital signs and the nursing notes.  Pertinent labs & imaging results that were available during my care of the patient were reviewed by me and considered in my medical decision making (see chart for details).  Pain likel;y secondary to arthritis and walking a lot.  Ice, elevation and NSAIDS.  Negative Homan's sign.  Highly doubt DVT as pain in the anterior knee.    Nyana Haren Anstey was evaluated in Emergency Department on 11/08/2020 for the symptoms described in the history of present illness. She was evaluated in the context of the global COVID-19 pandemic, which necessitated consideration that the patient might be at risk for infection with the SARS-CoV-2 virus that causes COVID-19. Institutional protocols and algorithms that pertain to the evaluation of patients at risk for COVID-19 are in a state of rapid change based on information released by regulatory bodies including the CDC and federal and state organizations. These policies and algorithms were followed during the patient's care in the ED.  Final Clinical Impression(s) / ED Diagnoses Final diagnoses:  Leg pain  Knee pain, acute  Return for intractable cough, coughing up blood, fevers > 100.4 unrelieved by medication, shortness of breath, intractable vomiting, chest pain, shortness of breath, weakness, numbness, changes in speech, facial asymmetry, abdominal pain, passing out, Inability to tolerate liquids or food, cough, altered mental status or any concerns. No signs of systemic illness or infection. The patient is nontoxic-appearing on exam and vital signs are within normal limits.  I have reviewed the triage vital signs and the nursing notes. Pertinent labs & imaging results that were available during my care  of the patient were reviewed by me and considered in my medical decision making (see chart for details). After history, exam, and medical workup I feel the patient has been appropriately medically screened and is safe for discharge home. Pertinent diagnoses were discussed with the patient. Patient was given return precautions.   Rx / DC Orders ED Discharge Orders     None        Elim Economou, MD 11/08/20 0160

## 2021-11-02 ENCOUNTER — Encounter (HOSPITAL_BASED_OUTPATIENT_CLINIC_OR_DEPARTMENT_OTHER): Payer: Self-pay

## 2021-11-02 ENCOUNTER — Other Ambulatory Visit: Payer: Self-pay

## 2021-11-02 ENCOUNTER — Emergency Department (HOSPITAL_BASED_OUTPATIENT_CLINIC_OR_DEPARTMENT_OTHER)
Admission: EM | Admit: 2021-11-02 | Discharge: 2021-11-02 | Disposition: A | Discharge status: Home / Self Care | Payer: 59 | Attending: Emergency Medicine | Admitting: Emergency Medicine

## 2021-11-02 DIAGNOSIS — R112 Nausea with vomiting, unspecified: Secondary | ICD-10-CM | POA: Diagnosis present

## 2021-11-02 DIAGNOSIS — D72829 Elevated white blood cell count, unspecified: Secondary | ICD-10-CM | POA: Diagnosis not present

## 2021-11-02 DIAGNOSIS — A084 Viral intestinal infection, unspecified: Secondary | ICD-10-CM | POA: Diagnosis not present

## 2021-11-02 DIAGNOSIS — J45909 Unspecified asthma, uncomplicated: Secondary | ICD-10-CM | POA: Diagnosis not present

## 2021-11-02 LAB — COMPREHENSIVE METABOLIC PANEL
ALT: 22 U/L (ref 0–44)
AST: 17 U/L (ref 15–41)
Albumin: 4.8 g/dL (ref 3.5–5.0)
Alkaline Phosphatase: 50 U/L (ref 38–126)
Anion gap: 12 (ref 5–15)
BUN: 12 mg/dL (ref 6–20)
CO2: 23 mmol/L (ref 22–32)
Calcium: 9.3 mg/dL (ref 8.9–10.3)
Chloride: 102 mmol/L (ref 98–111)
Creatinine, Ser: 0.71 mg/dL (ref 0.44–1.00)
GFR, Estimated: >60 mL/min (ref >60)
Glucose, Bld: 112 mg/dL — ABNORMAL HIGH (ref 70–99)
Potassium: 3.8 mmol/L (ref 3.5–5.1)
Sodium: 137 mmol/L (ref 135–145)
Total Bilirubin: 0.7 mg/dL (ref 0.3–1.2)
Total Protein: 8.5 g/dL — ABNORMAL HIGH (ref 6.5–8.1)

## 2021-11-02 LAB — URINALYSIS, ROUTINE W REFLEX MICROSCOPIC
Bilirubin Urine: NEGATIVE
Glucose, UA: NEGATIVE mg/dL
Hgb urine dipstick: NEGATIVE
Ketones, ur: NEGATIVE mg/dL
Leukocytes,Ua: NEGATIVE
Nitrite: NEGATIVE
Protein, ur: 100 mg/dL — AB
Specific Gravity, Urine: 1.023 (ref 1.005–1.030)
pH: 7 (ref 5.0–8.0)

## 2021-11-02 LAB — CBC
HCT: 44 % (ref 36.0–46.0)
Hemoglobin: 14.1 g/dL (ref 12.0–15.0)
MCH: 27 pg (ref 26.0–34.0)
MCHC: 32 g/dL (ref 30.0–36.0)
MCV: 84.1 fL (ref 80.0–100.0)
Platelets: 359 10*3/uL (ref 150–400)
RBC: 5.23 MIL/uL — ABNORMAL HIGH (ref 3.87–5.11)
RDW: 14.4 % (ref 11.5–15.5)
WBC: 11.2 10*3/uL — ABNORMAL HIGH (ref 4.0–10.5)
nRBC: 0 % (ref 0.0–0.2)

## 2021-11-02 LAB — LIPASE, BLOOD: Lipase: 12 U/L (ref 11–51)

## 2021-11-02 LAB — PREGNANCY, URINE: Preg Test, Ur: NEGATIVE

## 2021-11-02 MED ORDER — ONDANSETRON HCL 4 MG PO TABS: 4.0000 mg | ORAL_TABLET | Freq: Four times a day (QID) | ORAL | 0 refills | Status: DC

## 2021-11-02 MED ORDER — FAMOTIDINE 20 MG PO TABS
20.0000 mg | ORAL_TABLET | Freq: Once | ORAL | Status: AC
  Administered 2021-11-02: 20 mg via ORAL
  Filled 2021-11-02: qty 1

## 2021-11-02 MED ORDER — LIDOCAINE VISCOUS HCL 2 % MT SOLN
15.0000 mL | Freq: Once | OROMUCOSAL | Status: AC
  Administered 2021-11-02: 15 mL via ORAL
  Filled 2021-11-02: qty 15

## 2021-11-02 MED ORDER — ALUM & MAG HYDROXIDE-SIMETH 200-200-20 MG/5ML PO SUSP
30.0000 mL | Freq: Once | ORAL | Status: AC
  Administered 2021-11-02: 30 mL via ORAL
  Filled 2021-11-02: qty 30

## 2021-11-02 MED ORDER — KETOROLAC TROMETHAMINE 30 MG/ML IJ SOLN
15.0000 mg | Freq: Once | INTRAMUSCULAR | Status: AC
  Administered 2021-11-02: 15 mg via INTRAVENOUS
  Filled 2021-11-02: qty 1

## 2021-11-02 MED ORDER — SODIUM CHLORIDE 0.9 % IV BOLUS
1000.0000 mL | Freq: Once | INTRAVENOUS | Status: AC
  Administered 2021-11-02: 1000 mL via INTRAVENOUS

## 2021-11-02 MED ORDER — ONDANSETRON HCL 4 MG/2ML IJ SOLN
4.0000 mg | Freq: Once | INTRAMUSCULAR | Status: AC
  Administered 2021-11-02: 4 mg via INTRAVENOUS
  Filled 2021-11-02: qty 2

## 2021-11-02 NOTE — Discharge Instructions (Signed)
Note the work-up today was overall reassuring.  Take nausea/vomiting medicine as needed when you experience symptoms.  Recommend continued oral hydration outpatient.  You can take it Tylenol/ibuprofen as needed for pain.  Follow-up with PCP in 3 to 5 days for reevaluation of your symptoms.  Please not hesitate to return to emergency department for worrisome signs and symptoms we discussed to compare.

## 2021-11-02 NOTE — ED Triage Notes (Signed)
Patient here POV from Home.  Endorses Onset of N/V/D that began at 0300 today. Some associated ABD Pain and Headache. States it may be food-related as this occurred after eating at 2100 yesterday. No Known Fevers.   NAD Noted during Triage. A&Ox4. Gcs 15. Ambulatory.

## 2021-11-02 NOTE — ED Notes (Signed)
Pt tolerating po fluid and food

## 2021-11-02 NOTE — ED Provider Notes (Signed)
[place] EMERGENCY DEPT Provider Note   CSN: [id number] Arrival date & time: 11/02/21  1546     History  Chief Complaint  Patient presents with   Emesis    Carol Haney is a 31 y.o. female.   Emesis   31 year old female presents emergency department with complaints of nausea, vomiting, diarrhea, epigastric abdominal pain as well as headache.  Patient states symptoms began abruptly around 3 AM this morning.  She reports eating out last night with her family but none of her family is experiencing the same symptoms.  She does note some of the members of her extended family have been dealing with similar symptoms over the past few weeks with most recent exposure being her nephew.  She denies fever, chills, night sweats, chest pain, shortness of breath, hematemesis, urinary/vaginal symptoms, hematochezia/melena.  Past medical history significant for asthma  Home Medications Prior to Admission medications   Medication Sig Start Date End Date Taking? Authorizing Provider  ondansetron (ZOFRAN) 4 MG tablet Take 1 tablet (4 mg total) by mouth every 6 (six) hours. 11/02/21  Yes Dion Saucier A, PA  albuterol (VENTOLIN HFA) 108 (90 Base) MCG/ACT inhaler Inhale 2 puffs into the lungs every 4 (four) hours as needed for wheezing or shortness of breath. 09/04/20   Palumbo, April, MD  bacitracin ointment Apply 1 application 2 (two) times daily topically. Patient not taking: Reported on 01/25/2018 11/17/16   Waynetta Pean, PA-C  clindamycin (CLEOCIN) 300 MG capsule Take 1 capsule (300 mg total) by mouth every 6 (six) hours. Patient not taking: Reported on 01/25/2018 04/20/16   Tat, Shanon Brow, MD  lidocaine (LIDODERM) 5 % Place 1 patch onto the skin daily. Remove & Discard patch within 12 hours or as directed by MD 11/08/20   Randal Buba, April, MD  meloxicam (MOBIC) 15 MG tablet Take 1 tablet (15 mg total) by mouth daily. 11/08/20   Palumbo, April, MD      Allergies    Other     Review of Systems   Review of Systems  Gastrointestinal:  Positive for vomiting.  All other systems reviewed and are negative.   Physical Exam Updated Vital Signs BP 131/72   Pulse (!) 108   Temp 99.4 F (37.4 C)   Resp 18   Ht 5\' 5"  (1.651 m)   Wt 122.5 kg   SpO2 98%   BMI 44.94 kg/m  Physical Exam Vitals and nursing note reviewed.  Constitutional:      General: She is not in acute distress.    Appearance: She is well-developed.  HENT:     Head: Normocephalic and atraumatic.  Eyes:     Conjunctiva/sclera: Conjunctivae normal.  Cardiovascular:     Rate and Rhythm: Normal rate and regular rhythm.     Heart sounds: No murmur heard. Pulmonary:     Effort: Pulmonary effort is normal. No respiratory distress.     Breath sounds: Normal breath sounds.  Abdominal:     Palpations: Abdomen is soft.     Tenderness: There is abdominal tenderness. There is no right CVA tenderness or left CVA tenderness.     Comments: Mild epigastric tenderness.  Musculoskeletal:        General: No swelling.     Cervical back: Neck supple. No rigidity or tenderness.     Right lower leg: No edema.     Left lower leg: No edema.  Skin:    General: Skin is warm and dry.     Capillary  Refill: Capillary refill takes less than 2 seconds.  Neurological:     Mental Status: She is alert.  Psychiatric:        Mood and Affect: Mood normal.     ED Results / Procedures / Treatments   Labs (all labs ordered are listed, but only abnormal results are displayed) Labs Reviewed  COMPREHENSIVE METABOLIC PANEL - Abnormal; Notable for the following components:      Result Value   Glucose, Bld 112 (*)    Total Protein 8.5 (*)    All other components within normal limits  CBC - Abnormal; Notable for the following components:   WBC 11.2 (*)    RBC 5.23 (*)    All other components within normal limits  URINALYSIS, ROUTINE W REFLEX MICROSCOPIC - Abnormal; Notable for the following components:   Protein,  ur 100 (*)    Bacteria, UA RARE (*)    All other components within normal limits  LIPASE, BLOOD  PREGNANCY, URINE    EKG None  Radiology No results found.  Procedures Procedures    Medications Ordered in ED Medications  sodium chloride 0.9 % bolus 1,000 mL (1,000 mLs Intravenous New Bag/Given 11/02/21 1736)  ondansetron (ZOFRAN) injection 4 mg (4 mg Intravenous Given 11/02/21 1734)  ketorolac (TORADOL) 30 MG/ML injection 15 mg (15 mg Intravenous Given 11/02/21 1758)  famotidine (PEPCID) tablet 20 mg (20 mg Oral Given 11/02/21 1758)  alum & mag hydroxide-simeth (MAALOX/MYLANTA) 200-200-20 MG/5ML suspension 30 mL (30 mLs Oral Given 11/02/21 1757)    And  lidocaine (XYLOCAINE) 2 % viscous mouth solution 15 mL (15 mLs Oral Given 11/02/21 1758)    ED Course/ Medical Decision Making/ A&P                           Medical Decision Making Amount and/or Complexity of Data Reviewed Labs: ordered.  Risk OTC drugs. Prescription drug management.   This patient presents to the ED for concern of abdominal pain, nausea, vomiting, diarrhea, this involves an extensive number of treatment options, and is a complaint that carries with it a high risk of complications and morbidity.  The differential diagnosis includes gastroenteritis, diverticulitis, appendicitis, ectopic pregnancy, ovarian torsion, cholecystitis, hepatitis, SBO/LBO   Co morbidities that complicate the patient evaluation  See HPI   Additional history obtained:  Additional history obtained from EMR External records from outside source obtained and reviewed including hospital records   Lab Tests:  I Ordered, and personally interpreted labs.  The pertinent results include: Mild leukocytosis of 11.2.  No evidence anemia.  Platelets within range.  Electrolytes without abnormalities.  No transaminitis noted.  Renal function within normal limits.  UA significant for rare bacteria but patient not symptomatic will not treat  with negative nitrite negative leukocyte.   Imaging Studies ordered:  N/a   Cardiac Monitoring: / EKG:  The patient was maintained on a cardiac monitor.  I personally viewed and interpreted the cardiac monitored which showed an underlying rhythm of: Sinus rhythm   Consultations Obtained:  N/a   Problem List / ED Course / Critical interventions / Medication management  Viral gastroenteritis I ordered medication including Pepcid, Maalox for GI cocktail.  Zofran for nausea.  Toradol for pain.  1 L normal saline for rehydration.   Reevaluation of the patient after these medicines showed that the patient resolved I have reviewed the patients home medicines and have made adjustments as needed   Social Determinants of  Health:  Denies tobacco, illicit drug use   Test / Admission - Considered:  Viral gastroenteritis Vitals signs significant for mild tachycardia which decreased with administration of fluids.. Otherwise within normal range and stable throughout visit. Laboratory studies significant for: See above Patient symptoms likely secondary to viral gastroenteritis.  Patient noted near resolution of symptoms with administration of fluids and medicines while in the emergency department.  Continued treatment with antiemetic/antinausea medication in the form of Zofran and proper oral hydration.  Recommend follow-up with PCP in 3 to 5 days.  Treatment plan discussed with patient she acknowledged understand was agreeable to said plan. Worrisome signs and symptoms were discussed with the patient, and the patient acknowledged understanding to return to the ED if noticed. Patient was stable upon discharge.          Final Clinical Impression(s) / ED Diagnoses Final diagnoses:  Viral gastroenteritis    Rx / DC Orders ED Discharge Orders          Ordered    ondansetron (ZOFRAN) 4 MG tablet  Every 6 hours        11/02/21 2008              Peter Garter,  [place] 11/02/21 2008    Elayne Snare K, DO 11/03/21 0003

## 2022-02-08 ENCOUNTER — Other Ambulatory Visit: Payer: Self-pay

## 2022-02-08 ENCOUNTER — Emergency Department (HOSPITAL_BASED_OUTPATIENT_CLINIC_OR_DEPARTMENT_OTHER)
Admission: EM | Admit: 2022-02-08 | Discharge: 2022-02-09 | Disposition: A | Discharge status: Home / Self Care | Payer: 59 | Attending: Emergency Medicine | Admitting: Emergency Medicine

## 2022-02-08 ENCOUNTER — Encounter (HOSPITAL_BASED_OUTPATIENT_CLINIC_OR_DEPARTMENT_OTHER): Payer: Self-pay

## 2022-02-08 DIAGNOSIS — I10 Essential (primary) hypertension: Secondary | ICD-10-CM | POA: Diagnosis not present

## 2022-02-08 DIAGNOSIS — J45909 Unspecified asthma, uncomplicated: Secondary | ICD-10-CM | POA: Insufficient documentation

## 2022-02-08 DIAGNOSIS — L02412 Cutaneous abscess of left axilla: Secondary | ICD-10-CM | POA: Insufficient documentation

## 2022-02-08 DIAGNOSIS — R030 Elevated blood-pressure reading, without diagnosis of hypertension: Secondary | ICD-10-CM

## 2022-02-08 MED ORDER — LIDOCAINE-EPINEPHRINE (PF) 2 %-1:200000 IJ SOLN
10.0000 mL | Freq: Once | INTRAMUSCULAR | Status: AC
  Administered 2022-02-08: 10 mL
  Filled 2022-02-08: qty 20

## 2022-02-08 NOTE — ED Triage Notes (Signed)
Patient here POV from Home.  Notes an Abscess to Left Axillary Region approximately 4-5 Days ago. No Drainage and has increased in Size since. No Known fevers.  NAD Noted during Triage. A&Ox4. GCS 15. Ambulatory.

## 2022-02-08 NOTE — ED Provider Notes (Signed)
[place] Provider Note   CSN: [id number] Arrival date & time: 02/08/22  2206     History  Chief Complaint  Patient presents with   Abscess    Carol Haney is a 32 y.o. female.  The history is provided by the patient.  Abscess She has history of asthma and frequent abscesses and comes in complaining of an abscess in her left axilla which she noticed about 5 days ago and has been increasing in size.  She has tried applying warm compresses without any improvement.  Denies fever or chills.   Home Medications Prior to Admission medications   Medication Sig Start Date End Date Taking? Authorizing Provider  albuterol (VENTOLIN HFA) 108 (90 Base) MCG/ACT inhaler Inhale 2 puffs into the lungs every 4 (four) hours as needed for wheezing or shortness of breath. 09/04/20   Palumbo, April, MD  bacitracin ointment Apply 1 application 2 (two) times daily topically. Patient not taking: Reported on 01/25/2018 11/17/16   Waynetta Pean, PA-C  clindamycin (CLEOCIN) 300 MG capsule Take 1 capsule (300 mg total) by mouth every 6 (six) hours. Patient not taking: Reported on 01/25/2018 04/20/16   Tat, Shanon Brow, MD  lidocaine (LIDODERM) 5 % Place 1 patch onto the skin daily. Remove & Discard patch within 12 hours or as directed by MD 11/08/20   Randal Buba, April, MD  meloxicam (MOBIC) 15 MG tablet Take 1 tablet (15 mg total) by mouth daily. 11/08/20   Palumbo, April, MD  ondansetron (ZOFRAN) 4 MG tablet Take 1 tablet (4 mg total) by mouth every 6 (six) hours. 11/02/21   Wilnette Kales, PA      Allergies    Other    Review of Systems   Review of Systems  All other systems reviewed and are negative.   Physical Exam Updated Vital Signs BP (!) 147/97 (BP Location: Right Arm)   Pulse 94   Temp 98.2 F (36.8 C) (Oral)   Resp 18   Ht 5\' 5"  (1.651 m)   Wt 122.5 kg   SpO2 99%   BMI 44.94 kg/m  Physical Exam Vitals and nursing note reviewed.   32 year  old female, resting comfortably and in no acute distress. Vital signs are significant for elevated blood pressure. Oxygen saturation is 99%, which is normal. Head is normocephalic and atraumatic. PERRLA, EOMI. Oropharynx is clear. Neck is nontender and supple without adenopathy or JVD. Back is nontender and there is no CVA tenderness. Lungs are clear without rales, wheezes, or rhonchi. Chest is nontender. Heart has regular rate and rhythm without murmur. Abdomen is soft, flat, nontender. Extremities have no cyanosis or edema, full range of motion is present. Skin: Abscess present in the left axilla with fluctuance but no overlying erythema. Neurologic: Mental status is normal, cranial nerves are intact, moves all extremities equally.  ED Results / Procedures / Treatments    Procedures .Marland KitchenIncision and Drainage  Date/Time: 02/09/2022 12:11 AM  Performed by: Delora Fuel, MD Authorized by: Delora Fuel, MD   Consent:    Consent obtained:  Verbal   Consent given by:  Patient   Risks, benefits, and alternatives were discussed: yes     Risks discussed:  Bleeding, incomplete drainage and pain   Alternatives discussed:  No treatment Universal protocol:    Procedure explained and questions answered to patient or proxy's satisfaction: yes     Relevant documents present and verified: yes     Required blood products, implants,  devices, and special equipment available: yes     Site/side marked: yes     Immediately prior to procedure, a time out was called: yes     Patient identity confirmed:  Verbally with patient and arm band Location:    Type:  Abscess   Location: Left axilla. Pre-procedure details:    Skin preparation:  Antiseptic wash Sedation:    Sedation type:  None Anesthesia:    Anesthesia method:  Local infiltration   Local anesthetic:  Lidocaine 2% WITH epi Procedure type:    Complexity:  Complex Procedure details:    Ultrasound guidance: no     Needle aspiration: no      Incision types:  Cruciate   Incision depth:  Subcutaneous   Wound management:  Probed and deloculated   Drainage:  Purulent   Drainage amount:  Copious   Wound treatment:  Wound left open   Packing materials:  None Post-procedure details:    Procedure completion:  Tolerated well, no immediate complications     Medications Ordered in ED Medications - No data to display  ED Course/ Medical Decision Making/ A&P                             Medical Decision Making Risk Prescription drug management.   Abscess left axilla treated with incision and drainage.  I have reviewed her past records, and she has several ED visits for breast abscesses, but none since 02/23/2018.  Final Clinical Impression(s) / ED Diagnoses Final diagnoses:  Abscess of left axilla  Elevated blood pressure reading without diagnosis of hypertension    Rx / DC Orders ED Discharge Orders     None         Delora Fuel, MD 16/10/96 [phone]

## 2022-02-09 NOTE — ED Notes (Signed)
Bandage applied to axilla area.

## 2022-02-20 ENCOUNTER — Other Ambulatory Visit: Payer: Self-pay

## 2022-02-20 DIAGNOSIS — R0981 Nasal congestion: Secondary | ICD-10-CM | POA: Insufficient documentation

## 2022-02-20 DIAGNOSIS — J45909 Unspecified asthma, uncomplicated: Secondary | ICD-10-CM | POA: Insufficient documentation

## 2022-02-20 DIAGNOSIS — Z1152 Encounter for screening for COVID-19: Secondary | ICD-10-CM | POA: Insufficient documentation

## 2022-02-20 DIAGNOSIS — R059 Cough, unspecified: Secondary | ICD-10-CM | POA: Diagnosis present

## 2022-02-20 DIAGNOSIS — Z8616 Personal history of COVID-19: Secondary | ICD-10-CM | POA: Diagnosis not present

## 2022-02-20 DIAGNOSIS — M791 Myalgia, unspecified site: Secondary | ICD-10-CM | POA: Diagnosis not present

## 2022-02-20 DIAGNOSIS — H9203 Otalgia, bilateral: Secondary | ICD-10-CM | POA: Insufficient documentation

## 2022-02-20 NOTE — ED Triage Notes (Signed)
POV from home, amb to triage, NAD.  Pt c/o cough that began this morning, throughout the day she has experienced bilateral ear pain, and body aches.  GCS 15, A&O x 4.

## 2022-02-21 ENCOUNTER — Emergency Department (HOSPITAL_BASED_OUTPATIENT_CLINIC_OR_DEPARTMENT_OTHER)
Admission: EM | Admit: 2022-02-21 | Discharge: 2022-02-21 | Disposition: A | Discharge status: Home / Self Care | Payer: 59 | Attending: Emergency Medicine | Admitting: Emergency Medicine

## 2022-02-21 ENCOUNTER — Emergency Department (HOSPITAL_BASED_OUTPATIENT_CLINIC_OR_DEPARTMENT_OTHER): Payer: 59 | Admitting: Radiology

## 2022-02-21 DIAGNOSIS — J069 Acute upper respiratory infection, unspecified: Secondary | ICD-10-CM

## 2022-02-21 LAB — RESP PANEL BY RT-PCR (RSV, FLU A&B, COVID)  RVPGX2
Influenza A by PCR: NEGATIVE
Influenza B by PCR: NEGATIVE
Resp Syncytial Virus by PCR: NEGATIVE
SARS Coronavirus 2 by RT PCR: NEGATIVE

## 2022-02-21 LAB — GROUP A STREP BY PCR: Group A Strep by PCR: NOT DETECTED

## 2022-02-21 MED ORDER — BENZONATATE 100 MG PO CAPS: 100.0000 mg | ORAL_CAPSULE | Freq: Three times a day (TID) | ORAL | 0 refills | Status: DC

## 2022-02-21 NOTE — Discharge Instructions (Signed)
COVID and flu swabs are negative but still possible as you have only been sick for 1 day.  Use Tylenol or ibuprofen as needed for aches and for fevers.  Follow-up with your doctor.  Return to the ED with chest pain, shortness of breath, not able to eat or drink or any other concerns.

## 2022-02-21 NOTE — ED Notes (Signed)
Reviewed AVS/discharge instruction with patient. Time allotted for and all questions answered. Patient is agreeable for d/c and escorted to ed exit by staff.  

## 2022-02-21 NOTE — ED Provider Notes (Signed)
[facility] Provider Note   CSN: [id number] Arrival date & time: 02/20/22  2312     History  Chief Complaint  Patient presents with   Cough    Carol Haney is a 32 y.o. female.  Patient with a history of asthma woke this morning with scratchy throat, cough, headache, chills, body aches and difficulty breathing.  Cough is nonproductive.  Associate with some nasal congestion, gradual onset frontal headache, scratchy throat, body aches and headache.  No known fever.  No vomiting.  No chest pain.  No abdominal pain.  Has had sick contacts at home. Not using any albuterol today.  Never been hospitalized for her asthma.  No pain with urination or blood in the urine.  The history is provided by the patient.  Cough Associated symptoms: headaches, myalgias, rhinorrhea and sore throat   Associated symptoms: no chest pain        Home Medications Prior to Admission medications   Medication Sig Start Date End Date Taking? Authorizing Provider  albuterol (VENTOLIN HFA) 108 (90 Base) MCG/ACT inhaler Inhale 2 puffs into the lungs every 4 (four) hours as needed for wheezing or shortness of breath. 09/04/20   Palumbo, April, MD  ondansetron (ZOFRAN) 4 MG tablet Take 1 tablet (4 mg total) by mouth every 6 (six) hours. 11/02/21   Wilnette Kales, PA      Allergies    Other    Review of Systems   Review of Systems  Constitutional:  Positive for activity change, appetite change and fatigue.  HENT:  Positive for congestion, rhinorrhea and sore throat.   Respiratory:  Positive for cough. Negative for chest tightness.   Cardiovascular:  Negative for chest pain.  Gastrointestinal:  Negative for abdominal pain, nausea and vomiting.  Genitourinary:  Negative for dysuria and hematuria.  Musculoskeletal:  Positive for arthralgias and myalgias.  Skin:  Negative for wound.  Neurological:  Positive for headaches. Negative for dizziness and weakness.    all other systems are negative except as noted in the HPI and PMH.    Physical Exam Updated Vital Signs BP (!) 164/102 (BP Location: Right Arm)   Pulse 89   Temp 98.7 F (37.1 C)   Resp 18   Ht 5' 5"$  (1.651 m)   Wt 117.9 kg   LMP 01/24/2022   SpO2 100%   BMI 43.27 kg/m  Physical Exam Vitals and nursing note reviewed.  Constitutional:      General: She is not in acute distress.    Appearance: She is well-developed.  HENT:     Head: Normocephalic and atraumatic.     Ears:     Comments: Scattered cerumen on left.  Oropharynx is clear, uvula is midline.    Mouth/Throat:     Pharynx: No oropharyngeal exudate.  Eyes:     Conjunctiva/sclera: Conjunctivae normal.     Pupils: Pupils are equal, round, and reactive to light.  Neck:     Comments: No meningismus. Cardiovascular:     Rate and Rhythm: Normal rate and regular rhythm.     Heart sounds: Normal heart sounds. No murmur heard. Pulmonary:     Effort: Pulmonary effort is normal. No respiratory distress.     Breath sounds: Normal breath sounds.  Chest:     Chest wall: No tenderness.  Abdominal:     Palpations: Abdomen is soft.     Tenderness: There is no abdominal tenderness. There is no guarding or rebound.  Musculoskeletal:        General: No tenderness. Normal range of motion.     Cervical back: Normal range of motion and neck supple.  Skin:    General: Skin is warm.     Capillary Refill: Capillary refill takes less than 2 seconds.  Neurological:     General: No focal deficit present.     Mental Status: She is alert and oriented to person, place, and time. Mental status is at baseline.     Cranial Nerves: No cranial nerve deficit.     Motor: No abnormal muscle tone.     Coordination: Coordination normal.     Comments: No ataxia on finger to nose bilaterally. No pronator drift. 5/5 strength throughout. CN 2-12 intact.Equal grip strength. Sensation intact.   Psychiatric:        Behavior: Behavior normal.      ED Results / Procedures / Treatments   Labs (all labs ordered are listed, but only abnormal results are displayed) Labs Reviewed  RESP PANEL BY RT-PCR (RSV, FLU A&B, COVID)  RVPGX2  GROUP A STREP BY PCR    EKG None  Radiology DG Chest 2 View  Result Date: 02/21/2022 CLINICAL DATA:  Cough. EXAM: CHEST - 2 VIEW COMPARISON:  February 02, 2016 FINDINGS: The heart size and mediastinal contours are within normal limits. Both lungs are clear. The visualized skeletal structures are unremarkable. IMPRESSION: No active cardiopulmonary disease. Electronically Signed   By: Virgina Norfolk M.D.   On: 02/21/2022 00:54    Procedures Procedures    Medications Ordered in ED Medications - No data to display  ED Course/ Medical Decision Making/ A&P                             Medical Decision Making Amount and/or Complexity of Data Reviewed Labs: ordered. Decision-making details documented in ED Course. Radiology: ordered and independent interpretation performed. Decision-making details documented in ED Course. ECG/medicine tests: ordered and independent interpretation performed. Decision-making details documented in ED Course.   1 day of URI symptoms with scratchy throat, headache, body aches, chills and cough.  Vital stable, no distress, no hypoxia or increased work of breathing.  COVID and flu swabs are negative.  Discussed these illnesses are still possible she has only been sick for 1 day.  Chest x-ray negative for infiltrate.  Results reviewed interpreted by me.  Rapid strep is negative.  Suspect likely viral URI.  Recommend supportive care with anti-inflammatories, antipyretics, oral hydration and antitussives.  PCP follow-up.  Return to the ED with chest pain, shortness of breath, not able to eat or drink or any other concerns.        Final Clinical Impression(s) / ED Diagnoses Final diagnoses:  None    Rx / DC Orders ED Discharge Orders     None          Iasiah Ozment, Annie Main, MD 02/21/22 [phone]

## 2022-02-26 ENCOUNTER — Other Ambulatory Visit: Payer: Self-pay

## 2022-02-26 ENCOUNTER — Emergency Department (HOSPITAL_BASED_OUTPATIENT_CLINIC_OR_DEPARTMENT_OTHER): Payer: 59 | Admitting: Radiology

## 2022-02-26 ENCOUNTER — Emergency Department (HOSPITAL_BASED_OUTPATIENT_CLINIC_OR_DEPARTMENT_OTHER)
Admission: EM | Admit: 2022-02-26 | Discharge: 2022-02-26 | Disposition: A | Discharge status: Home / Self Care | Payer: 59 | Attending: Emergency Medicine | Admitting: Emergency Medicine

## 2022-02-26 DIAGNOSIS — J45909 Unspecified asthma, uncomplicated: Secondary | ICD-10-CM | POA: Diagnosis not present

## 2022-02-26 DIAGNOSIS — Z1152 Encounter for screening for COVID-19: Secondary | ICD-10-CM | POA: Insufficient documentation

## 2022-02-26 DIAGNOSIS — J209 Acute bronchitis, unspecified: Secondary | ICD-10-CM | POA: Insufficient documentation

## 2022-02-26 DIAGNOSIS — R0981 Nasal congestion: Secondary | ICD-10-CM | POA: Diagnosis present

## 2022-02-26 LAB — GROUP A STREP BY PCR: Group A Strep by PCR: NOT DETECTED

## 2022-02-26 LAB — RESP PANEL BY RT-PCR (RSV, FLU A&B, COVID)  RVPGX2
Influenza A by PCR: NEGATIVE
Influenza B by PCR: NEGATIVE
Resp Syncytial Virus by PCR: NEGATIVE
SARS Coronavirus 2 by RT PCR: NEGATIVE

## 2022-02-26 MED ORDER — IPRATROPIUM-ALBUTEROL 0.5-2.5 (3) MG/3ML IN SOLN
3.0000 mL | RESPIRATORY_TRACT | Status: DC
  Administered 2022-02-26: 3 mL via RESPIRATORY_TRACT
  Filled 2022-02-26: qty 3

## 2022-02-26 MED ORDER — FLUTICASONE PROPIONATE 50 MCG/ACT NA SUSP: 1.0000 | Freq: Every day | NASAL | 2 refills | Status: DC

## 2022-02-26 MED ORDER — DEXAMETHASONE SODIUM PHOSPHATE 10 MG/ML IJ SOLN
10.0000 mg | Freq: Once | INTRAMUSCULAR | Status: AC
  Administered 2022-02-26: 10 mg via INTRAMUSCULAR
  Filled 2022-02-26: qty 1

## 2022-02-26 MED ORDER — PREDNISONE 10 MG (21) PO TBPK: ORAL_TABLET | Freq: Every day | ORAL | 0 refills | Status: DC

## 2022-02-26 NOTE — ED Provider Notes (Signed)
[facility] Provider Note   CSN: [id number] Arrival date & time: 02/26/22  1557     History  Chief Complaint  Patient presents with   URI    Carol Haney is a 32 y.o. female.   URI    This is a 32 year old female presenting to the emergency department due to nasal congestion, sore throat, viral symptoms x 1 week.  Seen a week ago in the ED and has a negative for COVID at that time.  She is still having persistent symptoms, taking Mucinex, Tylenol Motrin with no relief.  She does have a history of asthma and is used her inhaler but has not really noticed a change.  Denies chest pain.   Home Medications Prior to Admission medications   Medication Sig Start Date End Date Taking? Authorizing Provider  fluticasone (FLONASE) 50 MCG/ACT nasal spray Place 1 spray into both nostrils daily. 02/26/22  Yes Sherrill Raring, PA-C  predniSONE (STERAPRED UNI-PAK 21 TAB) 10 MG (21) TBPK tablet Take by mouth daily. Take 6 tabs by mouth daily  for 2 days, then 5 tabs for 2 days, then 4 tabs for 2 days, then 3 tabs for 2 days, 2 tabs for 2 days, then 1 tab by mouth daily for 2 days 02/26/22  Yes Sherrill Raring, PA-C  albuterol (VENTOLIN HFA) 108 (90 Base) MCG/ACT inhaler Inhale 2 puffs into the lungs every 4 (four) hours as needed for wheezing or shortness of breath. 09/04/20   Palumbo, April, MD  benzonatate (TESSALON) 100 MG capsule Take 1 capsule (100 mg total) by mouth every 8 (eight) hours. 02/21/22   Rancour, Annie Main, MD  ondansetron (ZOFRAN) 4 MG tablet Take 1 tablet (4 mg total) by mouth every 6 (six) hours. 11/02/21   Wilnette Kales, PA      Allergies    Other    Review of Systems   Review of Systems  Physical Exam Updated Vital Signs BP (!) 140/93   Pulse 61   Temp 98.1 F (36.7 C) (Oral)   Resp 16   LMP 01/24/2022   SpO2 100%  Physical Exam Vitals and nursing note reviewed. Exam conducted with a chaperone present.  Constitutional:       Appearance: Normal appearance. She is obese.  HENT:     Head: Normocephalic and atraumatic.     Nose: Congestion present.     Mouth/Throat:     Comments: Uvula midline, no tonsillar exudate Eyes:     General: No scleral icterus.       Right eye: No discharge.        Left eye: No discharge.     Extraocular Movements: Extraocular movements intact.     Pupils: Pupils are equal, round, and reactive to light.  Cardiovascular:     Rate and Rhythm: Normal rate and regular rhythm.     Pulses: Normal pulses.     Heart sounds: Normal heart sounds.     No friction rub. No gallop.  Pulmonary:     Effort: Pulmonary effort is normal. No respiratory distress.     Breath sounds: Normal breath sounds.     Comments: Speaking complete sentence this, slightly decreased air sounds secondary to body habitus versus decreased air movement  Abdominal:     General: Abdomen is flat. Bowel sounds are normal. There is no distension.     Palpations: Abdomen is soft.     Tenderness: There is no abdominal tenderness.  Skin:  General: Skin is warm and dry.     Coloration: Skin is not jaundiced.  Neurological:     Mental Status: She is alert. Mental status is at baseline.     Coordination: Coordination normal.     ED Results / Procedures / Treatments   Labs (all labs ordered are listed, but only abnormal results are displayed) Labs Reviewed  GROUP A STREP BY PCR  RESP PANEL BY RT-PCR (RSV, FLU A&B, COVID)  RVPGX2    EKG None  Radiology DG Chest 2 View  Result Date: 02/26/2022 CLINICAL DATA:  Cough.  Chest tightness. EXAM: CHEST - 2 VIEW COMPARISON:  02/21/2022 FINDINGS: Heart and mediastinal shadows are normal. There may be mild central bronchial thickening. No infiltrate, collapse or effusion. IMPRESSION: Possible bronchitis. No consolidation or collapse. Electronically Signed   By: Nelson Chimes M.D.   On: 02/26/2022 17:46    Procedures Procedures    Medications Ordered in ED Medications   ipratropium-albuterol (DUONEB) 0.5-2.5 (3) MG/3ML nebulizer solution 3 mL (3 mLs Nebulization Given 02/26/22 2039)  dexamethasone (DECADRON) injection 10 mg (10 mg Intramuscular Given 02/26/22 2050)    ED Course/ Medical Decision Making/ A&P                             Medical Decision Making Amount and/or Complexity of Data Reviewed Radiology: ordered.  Risk Prescription drug management.   This is a 32 year old female presenting to the emergency department due to persistent viral symptoms.  On exam she is overall well-appearing, no tachypnea or respiratory distress.  She is not hypoxic and is in sinus rhythm.  Will order viral panel, strep test and chest x-ray given persistent symptoms and cough.  Will also try DuoNeb and Decadron given history of asthma and slightly decreased air movement on exam.  Patient is negative for COVID, flu, strep and chest x-ray shows acute bronchitis but no underlying pneumonia.  Do not think antibiotics would be indicated, I reevaluated the patient who is still speaking complete sentences.   I considered additional workup but she is not having any chest pain or shortness of breath, she is PERC negative and I do not think this is a PE or severe asthma exacerbation.  She is not hypoxic, suspect acute bronchitis.  She is also having nasal congestion, possible postnasal drip.  Will cover with Flonase.  I discussed return precautions with the patient will have her follow-up with her PCP this week for reevaluation.          Final Clinical Impression(s) / ED Diagnoses Final diagnoses:  Acute bronchitis, unspecified organism  Nasal congestion    Rx / DC Orders ED Discharge Orders          Ordered    fluticasone (FLONASE) 50 MCG/ACT nasal spray  Daily        02/26/22 2058    predniSONE (STERAPRED UNI-PAK 21 TAB) 10 MG (21) TBPK tablet  Daily        02/26/22 2058              Sherrill Raring, PA-C 02/26/22 2101    Malvin Johns, MD 02/26/22  2240

## 2022-02-26 NOTE — ED Triage Notes (Signed)
Pt here for continued flu-like symptoms since 2/12. Pt has had worsening sore throat, cough, fever, generalized body aches. Pt was seen here last week and was told it was too early to tell if she has covid/flu.

## 2022-02-26 NOTE — ED Notes (Signed)
RT educated pt on the importance of seeking an appointment with a pulmonologist for a PFT. Pt dx w/asthma/reactive airway as a young child, but has never had proper testing. RT educated pt that this will help establish a proper diagnosis and treatment plan for her symptoms. Pt verbalizes understanding of information/teaching.

## 2022-02-26 NOTE — Discharge Instructions (Addendum)
You are seen today in the emergency department due to viral symptoms.  You are negative for strep, COVID, flu.  Your chest x-ray did not show pneumonia but did show bronchitis which is usually a viral cause inflammation of the lungs.  Take the steroid Dosepak as prescribed.  Also try using the Flonase to see if that helps improve the sore throat and nasal congestion.  See your doctor next week for reevaluation if symptoms persist.  Return to the ED for significant chest pain, shortness of breath or new or concerning symptoms.

## 2022-06-08 ENCOUNTER — Emergency Department (HOSPITAL_BASED_OUTPATIENT_CLINIC_OR_DEPARTMENT_OTHER)
Admission: EM | Admit: 2022-06-08 | Discharge: 2022-06-09 | Disposition: A | Discharge status: Home / Self Care | Payer: 59 | Attending: Emergency Medicine | Admitting: Emergency Medicine

## 2022-06-08 ENCOUNTER — Encounter (HOSPITAL_BASED_OUTPATIENT_CLINIC_OR_DEPARTMENT_OTHER): Payer: Self-pay

## 2022-06-08 ENCOUNTER — Other Ambulatory Visit: Payer: Self-pay

## 2022-06-08 DIAGNOSIS — K0889 Other specified disorders of teeth and supporting structures: Secondary | ICD-10-CM | POA: Insufficient documentation

## 2022-06-08 MED ORDER — HYDROCODONE-ACETAMINOPHEN 5-325 MG PO TABS
1.0000 | ORAL_TABLET | Freq: Once | ORAL | Status: AC
  Administered 2022-06-09: 1 via ORAL
  Filled 2022-06-08: qty 1

## 2022-06-08 MED ORDER — PENICILLIN V POTASSIUM 250 MG PO TABS
500.0000 mg | ORAL_TABLET | Freq: Once | ORAL | Status: AC
  Administered 2022-06-09: 500 mg via ORAL
  Filled 2022-06-08: qty 2

## 2022-06-08 MED ORDER — KETOROLAC TROMETHAMINE 30 MG/ML IJ SOLN
30.0000 mg | Freq: Once | INTRAMUSCULAR | Status: AC
  Administered 2022-06-09: 30 mg via INTRAMUSCULAR
  Filled 2022-06-08: qty 1

## 2022-06-08 NOTE — ED Triage Notes (Signed)
Pt POV from home reporting L upper toothache X 1 week, suspects it is her wisdom teeth. Has been taking ibuprofen and doing salt water rinses with no relief. NAD noted in triage.

## 2022-06-09 MED ORDER — PENICILLIN V POTASSIUM 500 MG PO TABS: 500.0000 mg | ORAL_TABLET | Freq: Four times a day (QID) | ORAL | 0 refills | Status: AC

## 2022-06-09 MED ORDER — IBUPROFEN 600 MG PO TABS: 600.0000 mg | ORAL_TABLET | Freq: Four times a day (QID) | ORAL | 0 refills | Status: DC | PRN

## 2022-06-09 NOTE — ED Provider Notes (Signed)
[facility] EMERGENCY DEPARTMENT AT [facility] Provider Note   CSN: [id number] Arrival date & time: 06/08/22  2314     History  Chief Complaint  Patient presents with   Dental Pain    Carol Haney is a 32 y.o. female.  HPI     This is a 32 year old female who presents with dental pain.  She is concerned she may have a wisdom tooth coming in.  She is complaining of pain in the left upper molar region.  She has not had any fevers or difficulty swallowing.  She did take some ibuprofen with minimal relief however the pain returned.  She has not been able to see a dentist.  Home Medications Prior to Admission medications   Medication Sig Start Date End Date Taking? Authorizing Provider  ibuprofen (ADVIL) 600 MG tablet Take 1 tablet (600 mg total) by mouth every 6 (six) hours as needed. 06/09/22  Yes Aubrei Bouchie, Mayer Masker, MD  penicillin v potassium (VEETID) 500 MG tablet Take 1 tablet (500 mg total) by mouth 4 (four) times daily for 10 days. 06/09/22 06/19/22 Yes Loral Campi, Mayer Masker, MD  albuterol (VENTOLIN HFA) 108 (90 Base) MCG/ACT inhaler Inhale 2 puffs into the lungs every 4 (four) hours as needed for wheezing or shortness of breath. 09/04/20   Palumbo, April, MD  benzonatate (TESSALON) 100 MG capsule Take 1 capsule (100 mg total) by mouth every 8 (eight) hours. 02/21/22   Rancour, Jeannett Senior, MD  fluticasone (FLONASE) 50 MCG/ACT nasal spray Place 1 spray into both nostrils daily. 02/26/22   Theron Arista, PA-C  ondansetron (ZOFRAN) 4 MG tablet Take 1 tablet (4 mg total) by mouth every 6 (six) hours. 11/02/21   Peter Garter, PA  predniSONE (STERAPRED UNI-PAK 21 TAB) 10 MG (21) TBPK tablet Take by mouth daily. Take 6 tabs by mouth daily  for 2 days, then 5 tabs for 2 days, then 4 tabs for 2 days, then 3 tabs for 2 days, 2 tabs for 2 days, then 1 tab by mouth daily for 2 days 02/26/22   Theron Arista, PA-C      Allergies    Other    Review of Systems   Review of Systems   Constitutional:  Negative for fever.  HENT:  Positive for dental problem. Negative for trouble swallowing.   All other systems reviewed and are negative.   Physical Exam Updated Vital Signs BP (!) 131/91   Pulse 68   Temp 98.2 F (36.8 C) (Oral)   Resp 17   Ht 1.651 m (5\' 5" )   Wt 108.9 kg   LMP 05/24/2022 (Approximate)   SpO2 100%   BMI 39.94 kg/m  Physical Exam Vitals and nursing note reviewed.  Constitutional:      Appearance: She is well-developed. She is obese. She is not ill-appearing.  HENT:     Head: Normocephalic and atraumatic.     Mouth/Throat:     Mouth: Mucous membranes are moist.     Comments: Tenderness to palpation over the left upper molar, no palpable abscess, no adjacent swelling or facial swelling noted, no redness or fullness under the tongue Eyes:     Pupils: Pupils are equal, round, and reactive to light.  Cardiovascular:     Rate and Rhythm: Normal rate and regular rhythm.  Pulmonary:     Effort: Pulmonary effort is normal. No respiratory distress.  Abdominal:     Palpations: Abdomen is soft.  Musculoskeletal:     Cervical back:  Neck supple.  Lymphadenopathy:     Cervical: No cervical adenopathy.  Skin:    General: Skin is warm and dry.  Neurological:     Mental Status: She is alert and oriented to person, place, and time.  Psychiatric:        Mood and Affect: Mood normal.     ED Results / Procedures / Treatments   Labs (all labs ordered are listed, but only abnormal results are displayed) Labs Reviewed - No data to display  EKG None  Radiology No results found.  Procedures Procedures    Medications Ordered in ED Medications  ketorolac (TORADOL) 30 MG/ML injection 30 mg (30 mg Intramuscular Given 06/09/22 0004)  penicillin v potassium (VEETID) tablet 500 mg (500 mg Oral Given 06/09/22 0002)  HYDROcodone-acetaminophen (NORCO/VICODIN) 5-325 MG per tablet 1 tablet (1 tablet Oral Given 06/09/22 0002)    ED Course/ Medical Decision  Making/ A&P                             Medical Decision Making Risk Prescription drug management.   This patient presents to the ED for concern of dental pain, this involves an extensive number of treatment options, and is a complaint that carries with it a high risk of complications and morbidity.  I considered the following differential and admission for this acute, potentially life threatening condition.  The differential diagnosis includes infection, deep space infection, fracture, dental carry  MDM:    This is a 32 year old female who presents with dental pain.  She is overall nontoxic and vital signs are reassuring.  She did have some improvement with ibuprofen.  No palpable or drainable abscess on exam.  No significant signs of deep space infection such as Ludwig's angina.  Will treat her for occult infection with penicillin and scheduled ibuprofen for pain.  Recommend follow-up with her dentist later this week.  Patient stated understanding.  (Labs, imaging, consults)  Labs: I Ordered, and personally interpreted labs.  The pertinent results include: None  Imaging Studies ordered: I ordered imaging studies including none I independently visualized and interpreted imaging. I agree with the radiologist interpretation  Additional history obtained from chart review.  External records from outside source obtained and reviewed including prior evaluations  Cardiac Monitoring: The patient was not maintained on a cardiac monitor.  If on the cardiac monitor, I personally viewed and interpreted the cardiac monitored which showed an underlying rhythm of: N/A  Reevaluation: After the interventions noted above, I reevaluated the patient and found that they have :stayed the same  Social Determinants of Health:  lives independently  Disposition: Discharge  Co morbidities that complicate the patient evaluation  Past Medical History:  Diagnosis Date   Asthma      Medicines Meds  ordered this encounter  Medications   ketorolac (TORADOL) 30 MG/ML injection 30 mg   penicillin v potassium (VEETID) tablet 500 mg   HYDROcodone-acetaminophen (NORCO/VICODIN) 5-325 MG per tablet 1 tablet   penicillin v potassium (VEETID) 500 MG tablet    Sig: Take 1 tablet (500 mg total) by mouth 4 (four) times daily for 10 days.    Dispense:  40 tablet    Refill:  0   ibuprofen (ADVIL) 600 MG tablet    Sig: Take 1 tablet (600 mg total) by mouth every 6 (six) hours as needed.    Dispense:  30 tablet    Refill:  0    I  have reviewed the patients home medicines and have made adjustments as needed  Problem List / ED Course: Problem List Items Addressed This Visit   None Visit Diagnoses     Pain, dental    -  Primary                   Final Clinical Impression(s) / ED Diagnoses Final diagnoses:  Pain, dental    Rx / DC Orders ED Discharge Orders          Ordered    penicillin v potassium (VEETID) 500 MG tablet  4 times daily        06/09/22 0004    ibuprofen (ADVIL) 600 MG tablet  Every 6 hours PRN        06/09/22 0004              Shon Baton, MD 06/09/22 0013

## 2022-06-09 NOTE — ED Notes (Signed)
Reviewed AVS with patient, patient expressed understanding of directions, denies further questions at this time. 

## 2022-06-09 NOTE — Discharge Instructions (Signed)
You were seen today for dental pain.  Follow-up with your dentist later this week.  Take medications as prescribed.  If you develop fevers or difficulty swallowing, you should be reevaluated.

## 2022-07-13 ENCOUNTER — Ambulatory Visit (HOSPITAL_COMMUNITY)
Admission: EM | Admit: 2022-07-13 | Discharge: 2022-07-13 | Disposition: A | Discharge status: Home / Self Care | Payer: 59 | Attending: Internal Medicine | Admitting: Internal Medicine

## 2022-07-13 ENCOUNTER — Encounter (HOSPITAL_COMMUNITY): Payer: Self-pay | Admitting: Emergency Medicine

## 2022-07-13 DIAGNOSIS — L02412 Cutaneous abscess of left axilla: Secondary | ICD-10-CM

## 2022-07-13 MED ORDER — DOXYCYCLINE HYCLATE 100 MG PO CAPS: 100.0000 mg | ORAL_CAPSULE | Freq: Two times a day (BID) | ORAL | 0 refills | Status: DC

## 2022-07-13 NOTE — Discharge Instructions (Signed)
We drained your abscess today in the clinic and left open to continue to drain.  Continue to use warm compresses to the wound to further encourage drainage from the wound.  You may do this in the shower as well with gentle compresses to the area to allow more infected material to drain.   Take antibiotic as prescribed to treat infection to the abscess.   Change your dressings twice daily as the wound heals.  Do not apply any ointments, lotions, or powders to the wound.  You may take Tylenol as needed for pain once the numbing wears off.  If you notice any worsening signs of infection such as redness, swelling, fever, or worsening drainage, please return to urgent care for reevaluation.   

## 2022-07-13 NOTE — ED Triage Notes (Signed)
Pt c/o boil in left axilla about 3 -4 days ago. Reports put heat in area. Reports today it's become painful. Denies drainage.  Hx boils.

## 2022-07-13 NOTE — ED Provider Notes (Signed)
[facility]    CSN: [id number] Arrival date & time: 07/13/22  1851      History   Chief Complaint Chief Complaint  Patient presents with   Abscess    HPI Carol Haney is a 32 y.o. female.   Patient presents to urgent care for evaluation of abscess to the left axilla that has been growing in size and tenderness over the last 3 days. She has had abscess to the left axilla in the past. Denies past history of hidradenitis suppurativa diagnosis, but providers have suspected this in the past. She was recently treated for dental infection with Augmentin 5 weeks ago, no other recent antibiotics. No recent fevers, chills, nausea, vomiting, or injuries to the area. Has been using ibuprofen/tylenol for pain without relief. She has also been using warm compresses which she states has helped the abscess to become softer.      Past Medical History:  Diagnosis Date   Asthma     Patient Active Problem List   Diagnosis Date Noted   Impaired glucose tolerance 04/18/2016   Obesity, Class III, BMI 40-49.9 (morbid obesity) (HCC) 04/18/2016   Cellulitis of right breast    Abscess of breast 04/17/2016    Past Surgical History:  Procedure Laterality Date   CYSTECTOMY     TONSILLECTOMY      OB History   No obstetric history on file.      Home Medications    Prior to Admission medications   Medication Sig Start Date End Date Taking? Authorizing Provider  doxycycline (VIBRAMYCIN) 100 MG capsule Take 1 capsule (100 mg total) by mouth 2 (two) times daily. 07/13/22  Yes Carlisle Beers, FNP  albuterol (VENTOLIN HFA) 108 (90 Base) MCG/ACT inhaler Inhale 2 puffs into the lungs every 4 (four) hours as needed for wheezing or shortness of breath. 09/04/20   Palumbo, April, MD  benzonatate (TESSALON) 100 MG capsule Take 1 capsule (100 mg total) by mouth every 8 (eight) hours. 02/21/22   Rancour, Jeannett Senior, MD  fluticasone (FLONASE) 50 MCG/ACT nasal spray Place 1 spray into  both nostrils daily. 02/26/22   Theron Arista, PA-C  ibuprofen (ADVIL) 600 MG tablet Take 1 tablet (600 mg total) by mouth every 6 (six) hours as needed. 06/09/22   Horton, Mayer Masker, MD  ondansetron (ZOFRAN) 4 MG tablet Take 1 tablet (4 mg total) by mouth every 6 (six) hours. 11/02/21   Peter Garter, PA  predniSONE (STERAPRED UNI-PAK 21 TAB) 10 MG (21) TBPK tablet Take by mouth daily. Take 6 tabs by mouth daily  for 2 days, then 5 tabs for 2 days, then 4 tabs for 2 days, then 3 tabs for 2 days, 2 tabs for 2 days, then 1 tab by mouth daily for 2 days 02/26/22   Theron Arista, PA-C    Family History Family History  Problem Relation Age of Onset   Diabetes Other    Hypertension Other    Hyperlipidemia Other    CAD Other     Social History Social History   Tobacco Use   Smoking status: Never   Smokeless tobacco: Never  Substance Use Topics   Alcohol use: Yes    Comment: Occ   Drug use: No     Allergies   Other   Review of Systems Review of Systems Per HPI  Physical Exam Triage Vital Signs ED Triage Vitals [07/13/22 1907]  Enc Vitals Group     BP 135/89  Pulse Rate 79     Resp 17     Temp 98.3 F (36.8 C)     Temp Source Oral     SpO2 96 %     Weight      Height      Head Circumference      Peak Flow      Pain Score 8     Pain Loc      Pain Edu?      Excl. in GC?    No data found.  Updated Vital Signs BP 135/89 (BP Location: Right Arm)   Pulse 79   Temp 98.3 F (36.8 C) (Oral)   Resp 17   LMP 06/20/2022   SpO2 96%   Visual Acuity Right Eye Distance:   Left Eye Distance:   Bilateral Distance:    Right Eye Near:   Left Eye Near:    Bilateral Near:     Physical Exam Vitals and nursing note reviewed.  Constitutional:      Appearance: She is not ill-appearing or toxic-appearing.  HENT:     Head: Normocephalic and atraumatic.     Right Ear: Hearing and external ear normal.     Left Ear: Hearing and external ear normal.     Nose: Nose normal.      Mouth/Throat:     Lips: Pink.  Eyes:     General: Lids are normal. Vision grossly intact. Gaze aligned appropriately.     Extraocular Movements: Extraocular movements intact.     Conjunctiva/sclera: Conjunctivae normal.  Cardiovascular:     Rate and Rhythm: Normal rate and regular rhythm.     Heart sounds: Normal heart sounds, S1 normal and S2 normal.  Pulmonary:     Effort: Pulmonary effort is normal. No respiratory distress.     Breath sounds: Normal breath sounds and air entry.  Musculoskeletal:     Cervical back: Neck supple.  Skin:    General: Skin is warm and dry.     Capillary Refill: Capillary refill takes less than 2 seconds.     Findings: Abscess present. No rash.          Comments: 3cm by 1.5cm abscess to the left axilla with surrounding warmth and induration. Abscess is fluctuant.  Neurological:     General: No focal deficit present.     Mental Status: She is alert and oriented to person, place, and time. Mental status is at baseline.     Cranial Nerves: No dysarthria or facial asymmetry.  Psychiatric:        Mood and Affect: Mood normal.        Speech: Speech normal.        Behavior: Behavior normal.        Thought Content: Thought content normal.        Judgment: Judgment normal.      UC Treatments / Results  Labs (all labs ordered are listed, but only abnormal results are displayed) Labs Reviewed - No data to display  EKG   Radiology No results found.  Procedures Incision and Drainage  Date/Time: 07/13/2022 8:16 PM  Performed by: Carlisle Beers, FNP Authorized by: Carlisle Beers, FNP   Consent:    Consent obtained:  Verbal   Consent given by:  Patient   Risks, benefits, and alternatives were discussed: yes     Risks discussed:  Damage to other organs, bleeding, incomplete drainage, infection and pain   Alternatives discussed:  No treatment Universal protocol:  Patient identity confirmed:  Verbally with patient Location:     Type:  Abscess   Size:  3cm by 1.5cm   Location:  Upper extremity   Upper extremity location:  Arm   Arm location:  L upper arm Pre-procedure details:    Skin preparation:  Povidone-iodine Sedation:    Sedation type:  None Anesthesia:    Anesthesia method:  Local infiltration   Local anesthetic:  Lidocaine 1% WITH epi Procedure type:    Complexity:  Complex Procedure details:    Incision types:  Stab incision and single straight   Incision depth:  Dermal   Wound management:  Probed and deloculated and irrigated with saline   Drainage:  Bloody and purulent   Drainage amount:  Moderate   Wound treatment:  Wound left open   Packing materials:  None Post-procedure details:    Procedure completion:  Tolerated well, no immediate complications  (including critical care time)  Medications Ordered in UC Medications - No data to display  Initial Impression / Assessment and Plan / UC Course  I have reviewed the triage vital signs and the nursing notes.  Pertinent labs & imaging results that were available during my care of the patient were reviewed by me and considered in my medical decision making (see chart for details).   1. Left axilla abscess See incision and drainage note above for further detail regarding incision and drainage procedure, patient tolerated well.  Wound cleansed and dressed in clinic. Wound care discussed. Warm compresses recommended to allow wound to drain further. Doxycycline antibiotic as prescribed.  Tylenol may be used as needed for pain once numbing wears off.   Discussed red flag signs and symptoms of worsening condition,when to call the PCP office, return to urgent care, and when to seek higher level of care in the emergency department. Counseled patient regarding appropriate use of medications and potential side effects for all medications recommended or prescribed today. Patient verbalizes understanding and agreement with plan. Discharged in stable  condition.    Final Clinical Impressions(s) / UC Diagnoses   Final diagnoses:  Abscess of left axilla     Discharge Instructions      We drained your abscess today in the clinic and left open to continue to drain.  Continue to use warm compresses to the wound to further encourage drainage from the wound.  You may do this in the shower as well with gentle compresses to the area to allow more infected material to drain.   Take antibiotic as prescribed to treat infection to the abscess.   Change your dressings twice daily as the wound heals.  Do not apply any ointments, lotions, or powders to the wound.  You may take Tylenol as needed for pain once the numbing wears off.  If you notice any worsening signs of infection such as redness, swelling, fever, or worsening drainage, please return to urgent care for reevaluation.    ED Prescriptions     Medication Sig Dispense Auth. Provider   doxycycline (VIBRAMYCIN) 100 MG capsule Take 1 capsule (100 mg total) by mouth 2 (two) times daily. 20 capsule Carlisle Beers, FNP      PDMP not reviewed this encounter.   Carlisle Beers, [place] 07/14/22 2018

## 2022-09-30 ENCOUNTER — Encounter (HOSPITAL_COMMUNITY): Payer: Self-pay | Admitting: Emergency Medicine

## 2022-09-30 ENCOUNTER — Ambulatory Visit (HOSPITAL_COMMUNITY)
Admission: EM | Admit: 2022-09-30 | Discharge: 2022-09-30 | Disposition: A | Discharge status: Home / Self Care | Payer: 59 | Attending: Emergency Medicine | Admitting: Emergency Medicine

## 2022-09-30 ENCOUNTER — Other Ambulatory Visit: Payer: Self-pay

## 2022-09-30 DIAGNOSIS — J069 Acute upper respiratory infection, unspecified: Secondary | ICD-10-CM | POA: Insufficient documentation

## 2022-09-30 DIAGNOSIS — Z1152 Encounter for screening for COVID-19: Secondary | ICD-10-CM | POA: Diagnosis not present

## 2022-09-30 DIAGNOSIS — J029 Acute pharyngitis, unspecified: Secondary | ICD-10-CM | POA: Diagnosis present

## 2022-09-30 MED ORDER — GUAIFENESIN ER 600 MG PO TB12: 600.0000 mg | ORAL_TABLET | Freq: Two times a day (BID) | ORAL | 0 refills | Status: AC

## 2022-09-30 MED ORDER — FLUTICASONE PROPIONATE 50 MCG/ACT NA SUSP: 1.0000 | Freq: Two times a day (BID) | NASAL | 0 refills | Status: AC

## 2022-09-30 MED ORDER — PROMETHAZINE-DM 6.25-15 MG/5ML PO SYRP: 5.0000 mL | ORAL_SOLUTION | Freq: Four times a day (QID) | ORAL | 0 refills | Status: AC | PRN

## 2022-09-30 MED ORDER — IBUPROFEN 600 MG PO TABS: 600.0000 mg | ORAL_TABLET | Freq: Four times a day (QID) | ORAL | 0 refills | Status: AC | PRN

## 2022-09-30 MED ORDER — PSEUDOEPHEDRINE HCL 60 MG PO TABS: 60.0000 mg | ORAL_TABLET | Freq: Four times a day (QID) | ORAL | 0 refills | Status: AC | PRN

## 2022-09-30 MED ORDER — IBUPROFEN 600 MG PO TABS: 600.0000 mg | ORAL_TABLET | Freq: Four times a day (QID) | ORAL | 0 refills | Status: DC | PRN

## 2022-09-30 NOTE — Discharge Instructions (Addendum)
Take medications as ordered. Continue emergen-C Sore throat  - consider hot tea with lemon and honey.  Salt water gargles twice a day. Please follow-up with PCP for worsening symptoms. We will call you with the results of COVID test.

## 2022-09-30 NOTE — ED Triage Notes (Signed)
Headache and sore throat started Friday night and Saturday morning.  Headache is in forehead. Forehead is pounding.  Throat is sore on left side of neck. Complains of nasal congestion.  Emrgen-c medication.  Has taken mucinex.

## 2022-09-30 NOTE — ED Notes (Signed)
Provided blanket

## 2022-09-30 NOTE — ED Provider Notes (Signed)
[facility]    CSN: [id number] Arrival date & time: 09/30/22  1626      History   Chief Complaint Chief Complaint  Patient presents with   Sore Throat    HPI Carol Haney is a 32 y.o. female.   Patient has been sick x 1 day.  She has a unrelenting headache, sore throat, mild cough from postnasal drip.  She is currently taking Mucinex and emergency.  The history is provided by the patient.  Sore Throat This is a new problem. The current episode started yesterday. The problem occurs constantly. The problem has been gradually worsening. Associated symptoms include headaches. Nothing relieves the symptoms.    Past Medical History:  Diagnosis Date   Asthma     Patient Active Problem List   Diagnosis Date Noted   Impaired glucose tolerance 04/18/2016   Obesity, Class III, BMI 40-49.9 (morbid obesity) (HCC) 04/18/2016   Cellulitis of right breast    Abscess of breast 04/17/2016    Past Surgical History:  Procedure Laterality Date   CYSTECTOMY     TONSILLECTOMY      OB History   No obstetric history on file.      Home Medications    Prior to Admission medications   Medication Sig Start Date End Date Taking? Authorizing Provider  guaiFENesin (MUCINEX) 600 MG 12 hr tablet Take 1 tablet (600 mg total) by mouth 2 (two) times daily. 09/30/22  Yes Tyee Vandevoorde, Linde Gillis, NP  promethazine-dextromethorphan (PROMETHAZINE-DM) 6.25-15 MG/5ML syrup Take 5 mLs by mouth 4 (four) times daily as needed for cough. 09/30/22  Yes Ilo Beamon, Linde Gillis, NP  pseudoephedrine (SUDAFED) 60 MG tablet Take 1 tablet (60 mg total) by mouth every 6 (six) hours as needed for congestion. 09/30/22  Yes Hanzel Pizzo, Linde Gillis, NP  fluticasone (FLONASE) 50 MCG/ACT nasal spray Place 1 spray into both nostrils in the morning and at bedtime. 09/30/22   Christiaan Strebeck, Linde Gillis, NP  ibuprofen (ADVIL) 600 MG tablet Take 1 tablet (600 mg total) by mouth every 6 (six) hours as needed for fever or headache. 09/30/22    Glendale Youngblood, Linde Gillis, NP    Family History Family History  Problem Relation Age of Onset   Diabetes Other    Hypertension Other    Hyperlipidemia Other    CAD Other     Social History Social History   Tobacco Use   Smoking status: Never   Smokeless tobacco: Never  Vaping Use   Vaping status: Never Used  Substance Use Topics   Alcohol use: Yes    Comment: Occ   Drug use: No     Allergies   Other   Review of Systems Review of Systems  HENT:  Positive for congestion, ear pain, rhinorrhea, sinus pressure and sore throat.   Respiratory:  Positive for cough.   Neurological:  Positive for headaches.  All other systems reviewed and are negative.    Physical Exam Triage Vital Signs ED Triage Vitals  Encounter Vitals Group     BP 09/30/22 1738 (!) 154/82     Systolic BP Percentile --      Diastolic BP Percentile --      Pulse Rate 09/30/22 1738 76     Resp 09/30/22 1738 20     Temp 09/30/22 1738 98.7 F (37.1 C)     Temp Source 09/30/22 1738 Oral     SpO2 09/30/22 1738 97 %     Weight --  Height --      Head Circumference --      Peak Flow --      Pain Score 09/30/22 1735 6     Pain Loc --      Pain Education --      Exclude from Growth Chart --    No data found.  Updated Vital Signs BP (!) 154/82 (BP Location: Left Arm)   Pulse 76   Temp 98.7 F (37.1 C) (Oral)   Resp 20   LMP 09/12/2022 (Exact Date)   SpO2 97%   Visual Acuity Right Eye Distance:   Left Eye Distance:   Bilateral Distance:    Right Eye Near:   Left Eye Near:    Bilateral Near:     Physical Exam Constitutional:      Appearance: She is obese.  HENT:     Head: Normocephalic.     Ears:     Comments: Left tympanic membrane not visualized due to cerumen impaction. Right tympanic membrane retracted    Nose: Rhinorrhea present.     Right Sinus: Frontal sinus tenderness present.     Left Sinus: Frontal sinus tenderness present.     Mouth/Throat:     Mouth: Mucous membranes  are moist.     Pharynx: Posterior oropharyngeal erythema present.  Cardiovascular:     Rate and Rhythm: Normal rate and regular rhythm.     Heart sounds: Normal heart sounds.  Pulmonary:     Effort: Pulmonary effort is normal.     Breath sounds: Normal breath sounds.  Abdominal:     Palpations: Abdomen is soft.     Comments: Denies any nausea vomiting diarrhea  Lymphadenopathy:     Head:     Left side of head: Submandibular adenopathy present.     Comments: Tenderness with minimal swelling to the left submandibular lymph node.  Neurological:     General: No focal deficit present.     Mental Status: She is alert.     Comments: Reports generalized headache.        UC Treatments / Results  Labs (all labs ordered are listed, but only abnormal results are displayed) Labs Reviewed  SARS CORONAVIRUS 2 (TAT 6-24 HRS)    EKG   Radiology No results found.  Procedures Procedures (including critical care time)  Medications Ordered in UC Medications - No data to display  Initial Impression / Assessment and Plan / UC Course  I have reviewed the triage vital signs and the nursing notes.  Pertinent labs & imaging results that were available during my care of the patient were reviewed by me and considered in my medical decision making (see chart for details).   Patient presenting with symptoms of upper respiratory infection.  She does not recall any exposure to other sick people.  She has not been having fevers at home.  She is reporting congestion, postnasal drip, cough, facial pressure, and unrelenting headache.  She reports this headache is similar to when she had COVID last year.  We will do a COVID swab to rule out.  At this time we will do symptomatic treatment.  I have ordered fluticasone nasal spray, guaifenesin 600 mg 1 tablet twice daily, ibuprofen 600 mg as needed for headache and fever, promethazine DM syrup for cough, and Sudafed for the congestion. We will call patient if  COVID test is positive  Final Clinical Impressions(s) / UC Diagnoses   Final diagnoses:  Viral URI with cough  Sore throat  Discharge Instructions      Take medications as ordered. Continue emergen-C Sore throat  - consider hot tea with lemon and honey.  Salt water gargles twice a day. Please follow-up with PCP for worsening symptoms. We will call you with the results of COVID test.     ED Prescriptions     Medication Sig Dispense Auth. Provider   fluticasone (FLONASE) 50 MCG/ACT nasal spray Place 1 spray into both nostrils in the morning and at bedtime. 16 g Jordanne Elsbury M, NP   ibuprofen (ADVIL) 600 MG tablet  (Status: Discontinued) Take 1 tablet (600 mg total) by mouth every 6 (six) hours as needed for fever or headache. 30 tablet Sianne Tejada, Linde Gillis, NP   pseudoephedrine (SUDAFED) 60 MG tablet Take 1 tablet (60 mg total) by mouth every 6 (six) hours as needed for congestion. 30 tablet Kym Fenter, Linde Gillis, NP   guaiFENesin (MUCINEX) 600 MG 12 hr tablet Take 1 tablet (600 mg total) by mouth 2 (two) times daily. 30 tablet Melis Trochez, Linde Gillis, NP   promethazine-dextromethorphan (PROMETHAZINE-DM) 6.25-15 MG/5ML syrup Take 5 mLs by mouth 4 (four) times daily as needed for cough. 118 mL Natia Fahmy M, NP   ibuprofen (ADVIL) 600 MG tablet Take 1 tablet (600 mg total) by mouth every 6 (six) hours as needed for fever or headache. 30 tablet Delante Karapetyan, Linde Gillis, NP      PDMP not reviewed this encounter.   Nelda Marseille, NP 09/30/22 [phone]

## 2022-10-01 LAB — SARS CORONAVIRUS 2 (TAT 6-24 HRS): SARS Coronavirus 2: NEGATIVE

## 2023-05-19 ENCOUNTER — Emergency Department (HOSPITAL_BASED_OUTPATIENT_CLINIC_OR_DEPARTMENT_OTHER)
Admission: EM | Admit: 2023-05-19 | Discharge: 2023-05-20 | Disposition: A | Discharge status: Home / Self Care | Attending: Emergency Medicine | Admitting: Emergency Medicine

## 2023-05-19 ENCOUNTER — Other Ambulatory Visit: Payer: Self-pay

## 2023-05-19 DIAGNOSIS — Z7951 Long term (current) use of inhaled steroids: Secondary | ICD-10-CM | POA: Diagnosis not present

## 2023-05-19 DIAGNOSIS — J45909 Unspecified asthma, uncomplicated: Secondary | ICD-10-CM | POA: Insufficient documentation

## 2023-05-19 DIAGNOSIS — R197 Diarrhea, unspecified: Secondary | ICD-10-CM | POA: Diagnosis present

## 2023-05-19 LAB — COMPREHENSIVE METABOLIC PANEL WITH GFR
ALT: 18 U/L (ref 0–44)
AST: 18 U/L (ref 15–41)
Albumin: 4.3 g/dL (ref 3.5–5.0)
Alkaline Phosphatase: 76 U/L (ref 38–126)
Anion gap: 11 (ref 5–15)
BUN: 10 mg/dL (ref 6–20)
CO2: 24 mmol/L (ref 22–32)
Calcium: 9.4 mg/dL (ref 8.9–10.3)
Chloride: 105 mmol/L (ref 98–111)
Creatinine, Ser: 0.88 mg/dL (ref 0.44–1.00)
GFR, Estimated: >60 mL/min (ref >60)
Glucose, Bld: 86 mg/dL (ref 70–99)
Potassium: 3.7 mmol/L (ref 3.5–5.1)
Sodium: 139 mmol/L (ref 135–145)
Total Bilirubin: <0.2 mg/dL (ref 0.0–1.2)
Total Protein: 7.4 g/dL (ref 6.5–8.1)

## 2023-05-19 LAB — URINALYSIS, ROUTINE W REFLEX MICROSCOPIC
Bacteria, UA: NONE SEEN
Bilirubin Urine: NEGATIVE
Glucose, UA: NEGATIVE mg/dL
Ketones, ur: NEGATIVE mg/dL
Leukocytes,Ua: NEGATIVE
Nitrite: NEGATIVE
Protein, ur: NEGATIVE mg/dL
Specific Gravity, Urine: 1.02 (ref 1.005–1.030)
pH: 6 (ref 5.0–8.0)

## 2023-05-19 LAB — CBC
HCT: 38.1 % (ref 36.0–46.0)
Hemoglobin: 12.2 g/dL (ref 12.0–15.0)
MCH: 26.3 pg (ref 26.0–34.0)
MCHC: 32 g/dL (ref 30.0–36.0)
MCV: 82.3 fL (ref 80.0–100.0)
Platelets: 359 10*3/uL (ref 150–400)
RBC: 4.63 MIL/uL (ref 3.87–5.11)
RDW: 14.2 % (ref 11.5–15.5)
WBC: 8.6 10*3/uL (ref 4.0–10.5)
nRBC: 0 % (ref 0.0–0.2)

## 2023-05-19 LAB — PREGNANCY, URINE: Preg Test, Ur: NEGATIVE

## 2023-05-19 LAB — LIPASE, BLOOD: Lipase: 27 U/L (ref 11–51)

## 2023-05-19 MED ORDER — SODIUM CHLORIDE 0.9 % IV BOLUS
1000.0000 mL | Freq: Once | INTRAVENOUS | Status: AC
  Administered 2023-05-19: 1000 mL via INTRAVENOUS

## 2023-05-19 NOTE — ED Triage Notes (Signed)
 Pt POV reporting diarrhea since Wed, taking imodium without improvement. One episode of emesis on Wed.

## 2023-05-19 NOTE — ED Provider Notes (Signed)
 [place] EMERGENCY DEPARTMENT AT [facility] Provider Note   CSN: [id number] Arrival date & time: 05/19/23  2234     History {Add pertinent medical, surgical, social history, OB history to HPI:1} Chief complaint: Diarrhea  Carol Haney is a 33 y.o. female.  The history is provided by the patient.  She has a history of asthma and comes in with 5-day history of diarrhea.  She started having abdominal pain and nausea in addition to the diarrhea and had fevers as high as 100.  However, there has been no nausea no fever and no abdominal pain for the last 4 days.  She has had headache intermittently.  She states she has a watery bowel movement every time she tries to eat or drink something.  She did go to urgent care and was told to take loperamide which has not helped at all.  She did have a sick contact and that there was a coworker who was sick with what was probably diarrhea the day before patient started having symptoms.  She has not had antibiotics recently, but was treated for sinusitis several months ago.   Home Medications Prior to Admission medications   Medication Sig Start Date End Date Taking? Authorizing Provider  fluticasone  (FLONASE ) 50 MCG/ACT nasal spray Place 1 spray into both nostrils in the morning and at bedtime. 09/30/22   Blitch, Dasie Epps, NP  guaiFENesin  (MUCINEX ) 600 MG 12 hr tablet Take 1 tablet (600 mg total) by mouth 2 (two) times daily. 09/30/22   Blitch, Dasie Epps, NP  ibuprofen  (ADVIL ) 600 MG tablet Take 1 tablet (600 mg total) by mouth every 6 (six) hours as needed for fever or headache. 09/30/22   Blitch, Dasie Epps, NP  promethazine -dextromethorphan (PROMETHAZINE -DM) 6.25-15 MG/5ML syrup Take 5 mLs by mouth 4 (four) times daily as needed for cough. 09/30/22   Blitch, Dasie Epps, NP  pseudoephedrine  (SUDAFED) 60 MG tablet Take 1 tablet (60 mg total) by mouth every 6 (six) hours as needed for congestion. 09/30/22   Blitch, Dasie Epps, NP      Allergies     Other    Review of Systems   Review of Systems  All other systems reviewed and are negative.   Physical Exam Updated Vital Signs BP (!) 168/99   Pulse 82   Temp 98.4 F (36.9 C)   Resp 18   Ht 5\' 5"  (1.651 m)   Wt 108.9 kg   LMP 05/14/2023 (Exact Date)   SpO2 97%   BMI 39.94 kg/m  Physical Exam Vitals and nursing note reviewed.   33 year old female, resting comfortably and in no acute distress. Vital signs are significant for elevated blood pressure. Oxygen  saturation is 97%, which is normal. Head is normocephalic and atraumatic. PERRLA, EOMI. Oropharynx is clear.  Mucous membranes are moist. Lungs are clear without rales, wheezes, or rhonchi. Chest is nontender. Heart has regular rate and rhythm without murmur. Abdomen is soft, flat, nontender. Neurologic: Mental status is normal, moves all extremities equally.  ED Results / Procedures / Treatments   Labs (all labs ordered are listed, but only abnormal results are displayed) Labs Reviewed  CBC  LIPASE, BLOOD  COMPREHENSIVE METABOLIC PANEL WITH GFR  URINALYSIS, ROUTINE W REFLEX MICROSCOPIC  PREGNANCY, URINE    EKG None  Radiology No results found.  Procedures Procedures  {Document cardiac monitor, telemetry assessment procedure when appropriate:1}  Medications Ordered in ED Medications - No data to display  ED Course/ Medical Decision Making/  A&P   {   Click here for ABCD2, HEART and other calculatorsREFRESH Note before signing :1}                              Medical Decision Making Amount and/or Complexity of Data Reviewed Labs: ordered.   Diarrhea which may be infectious.  Consider possibility of C. difficile, Salmonella, Shigella.  {Document critical care time when appropriate:1} {Document review of labs and clinical decision tools ie heart score, Chads2Vasc2 etc:1}  {Document your independent review of radiology images, and any outside records:1} {Document your discussion with family  members, caretakers, and with consultants:1} {Document social determinants of health affecting pt's care:1} {Document your decision making why or why not admission, treatments were needed:1} Final Clinical Impression(s) / ED Diagnoses Final diagnoses:  None    Rx / DC Orders ED Discharge Orders     None

## 2023-05-20 ENCOUNTER — Encounter (HOSPITAL_BASED_OUTPATIENT_CLINIC_OR_DEPARTMENT_OTHER): Payer: Self-pay | Admitting: Emergency Medicine

## 2023-05-20 MED ORDER — CHOLESTYRAMINE 4 G PO PACK: 4.0000 g | PACK | Freq: Four times a day (QID) | ORAL | 12 refills | Status: AC

## 2023-05-20 NOTE — Discharge Instructions (Addendum)
 You may continue taking loperamide (Imodium AD) as needed for the diarrhea.  You may also try taking Kaopectate or Pepto-Bismol.  I have given a prescription for cholestyramine which works in a similar way to Kaopectate and Pepto-Bismol.  Return to the emergency department if you develop severe abdominal pain, start running high fever, or start having blood in your stool.

## 2023-06-17 IMAGING — DX DG KNEE 1-2V PORT*R*
2 series · 4 of 4 positions shown · non-contrast
Comparison: None.

CLINICAL DATA: Right knee pain

EXAM:
PORTABLE RIGHT KNEE - 1-2 VIEW

[Series 1: knee · 0.14mm/px · 3 of 3 slices shown]
[im 1/3]
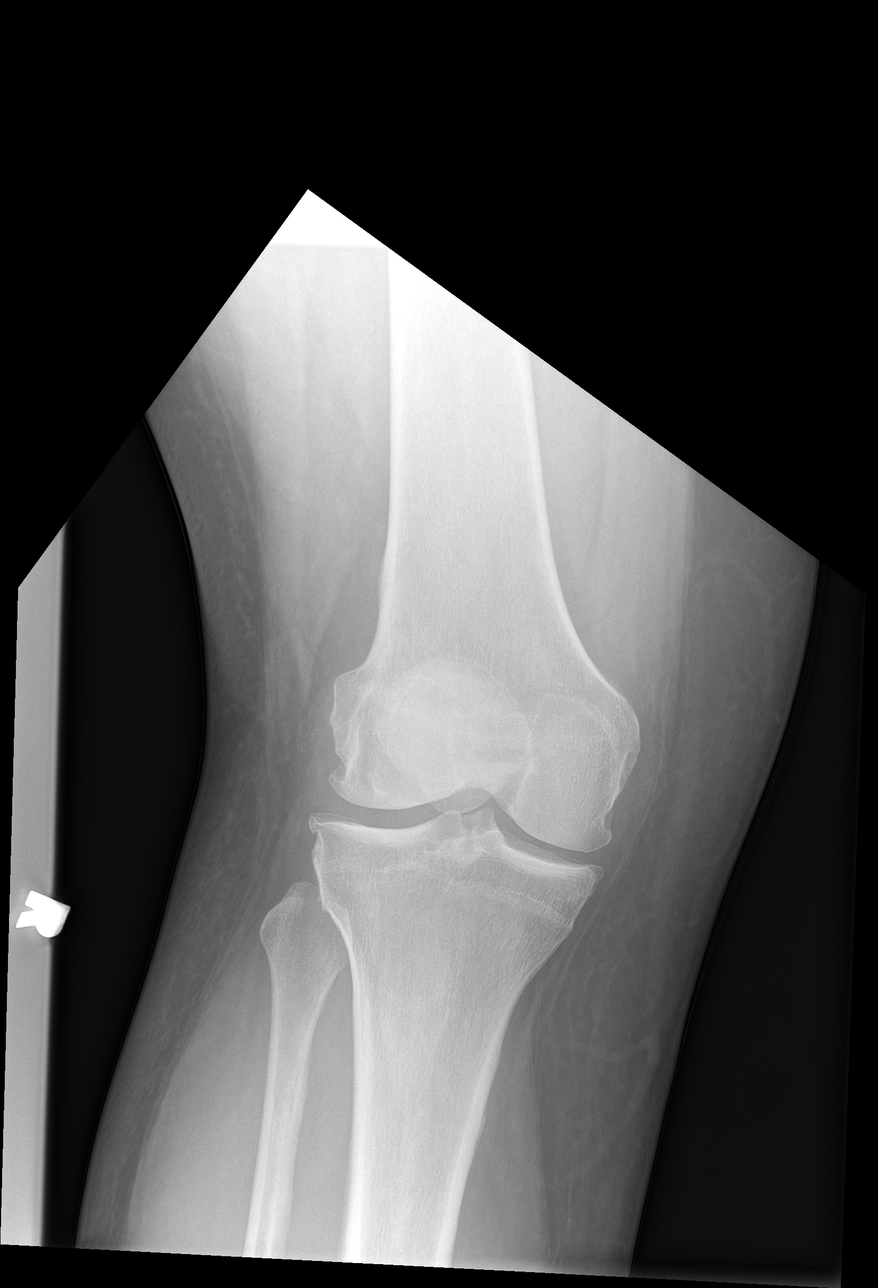
[im 2/3]
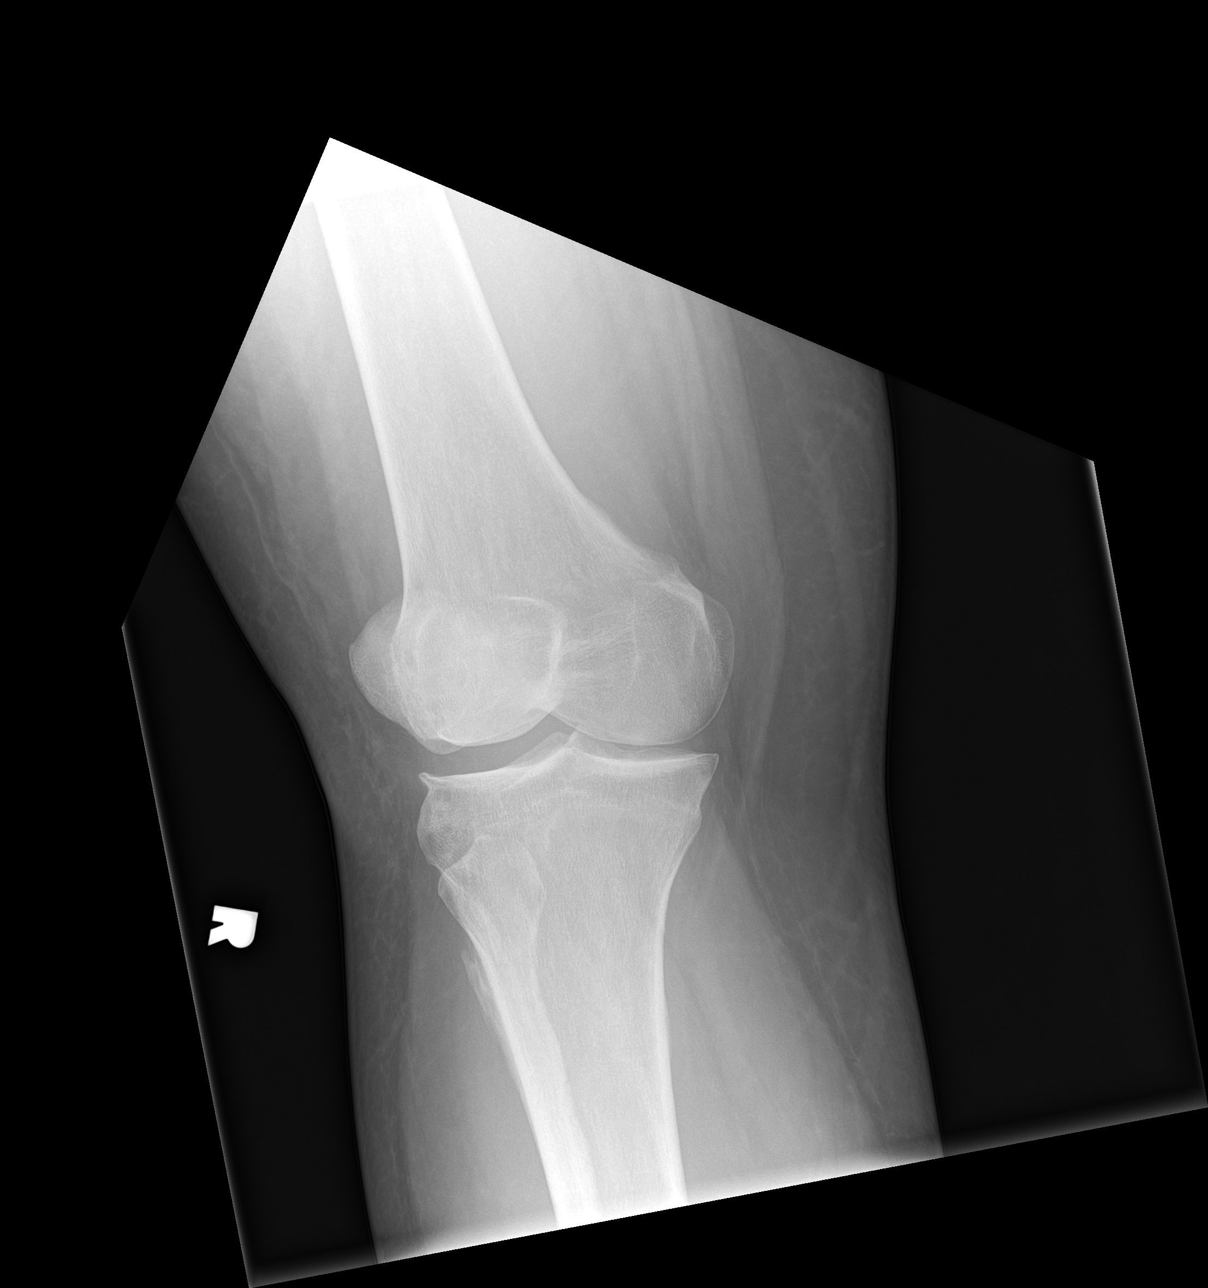
[im 3/3]
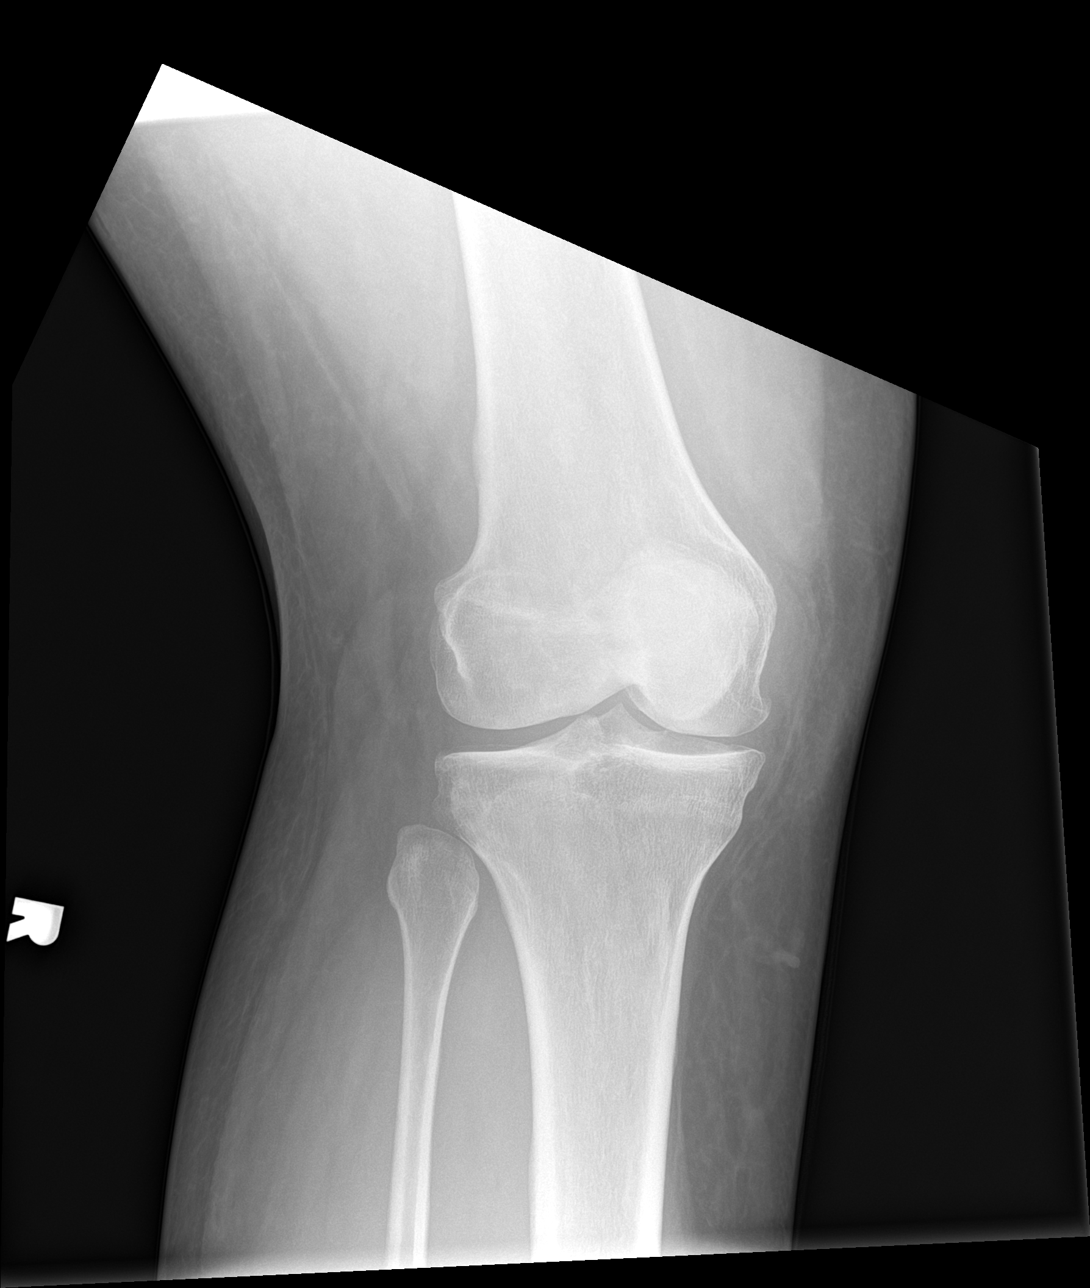

[leg]
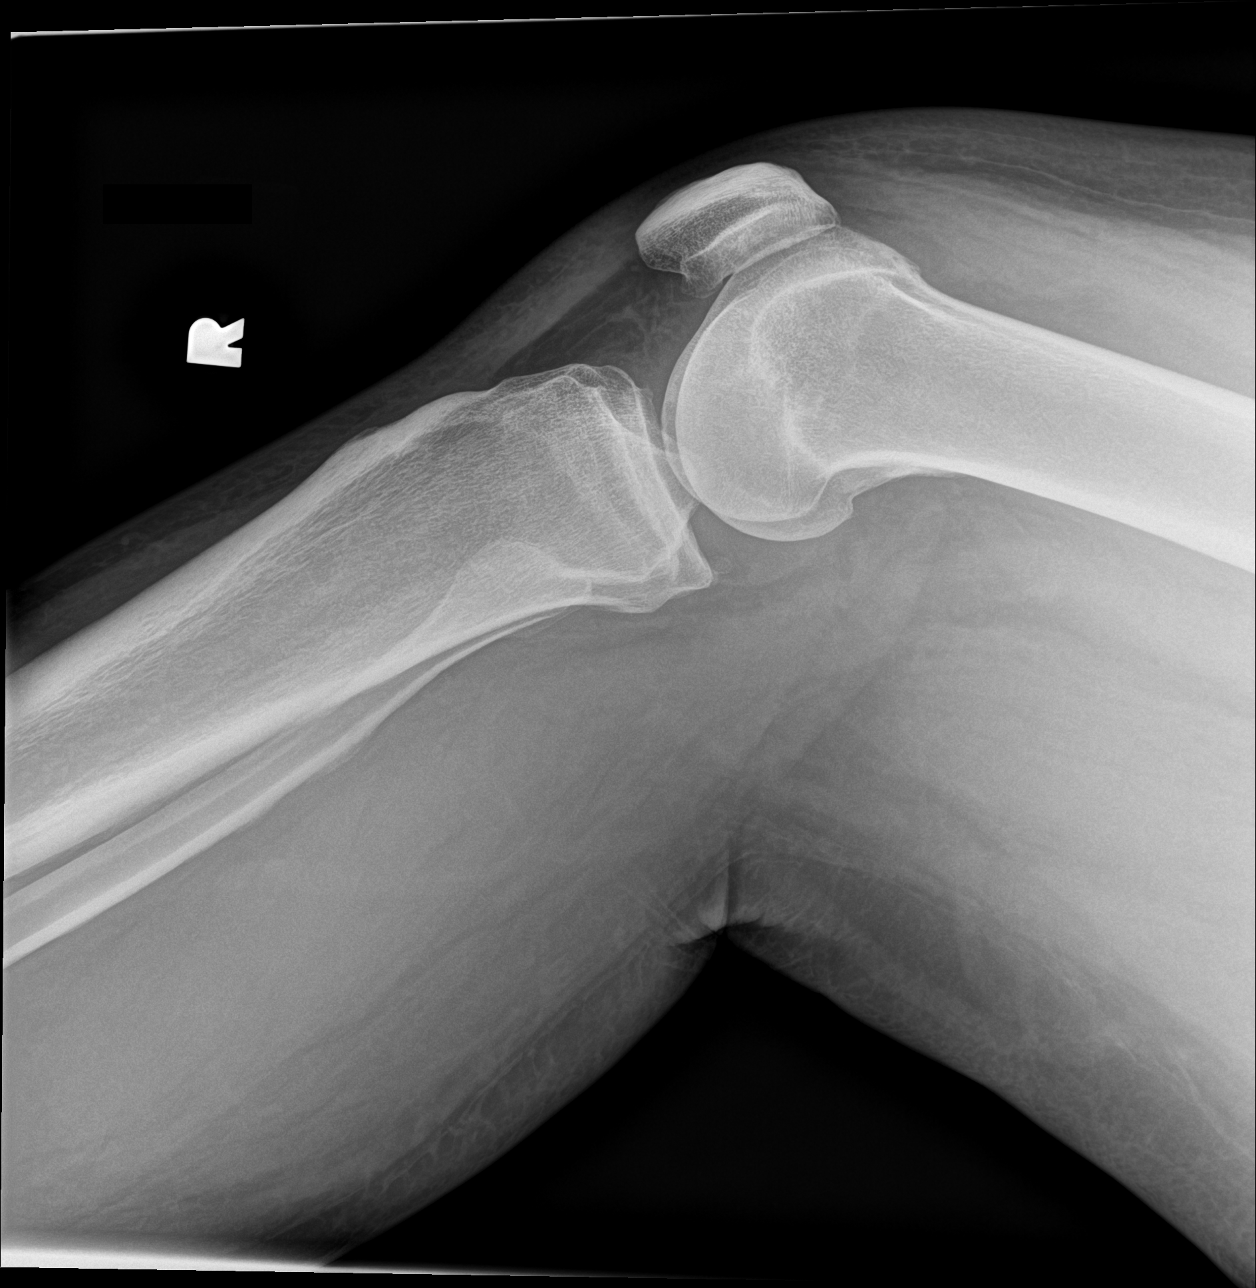

[4 of 4 positions shown; findings below may reference images not displayed]

FINDINGS: Normal alignment. No acute fracture or dislocation. Mild
bicompartmental degenerative arthritis involving the medial and
lateral compartments. No effusion. Soft tissues are unremarkable.
IMPRESSION: Mild bicompartmental degenerative arthritis.

## 2023-06-17 IMAGING — DX DG TIBIA/FIBULA PORT 2V*R*
1 series · 4 of 4 positions shown · non-contrast
Comparison: None.

CLINICAL DATA: Right leg pain

EXAM:
PORTABLE RIGHT TIBIA AND FIBULA - 2 VIEW

[Series 1: leg · 0.14mm/px · 4 of 4 slices shown]
[im 1/4]
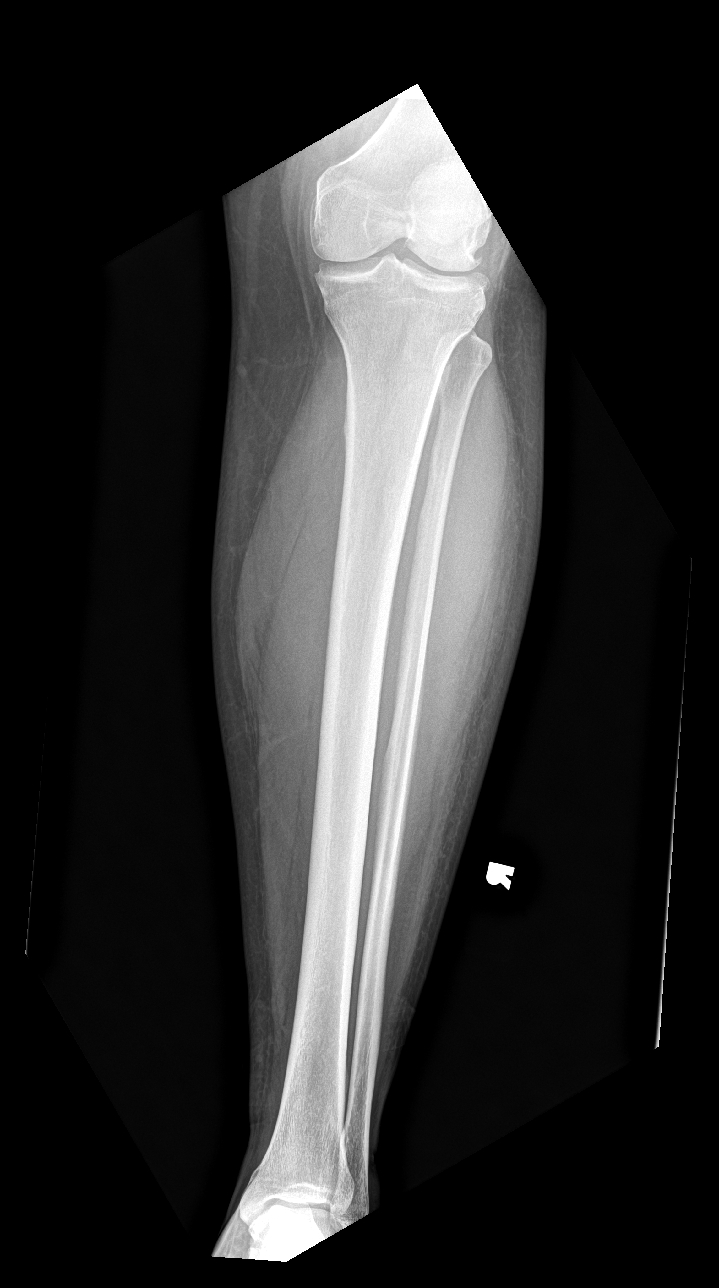
[im 2/4]
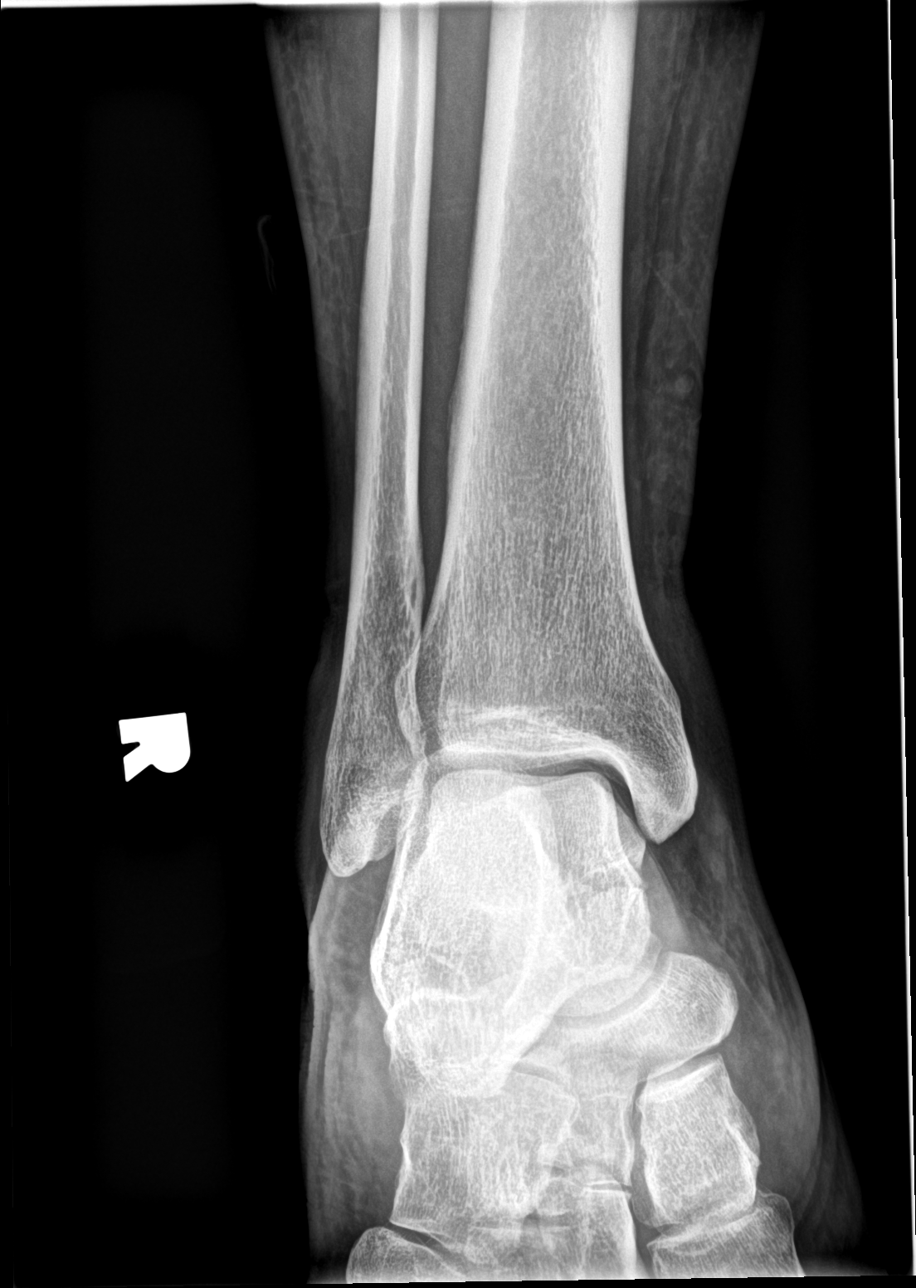
[im 3/4]
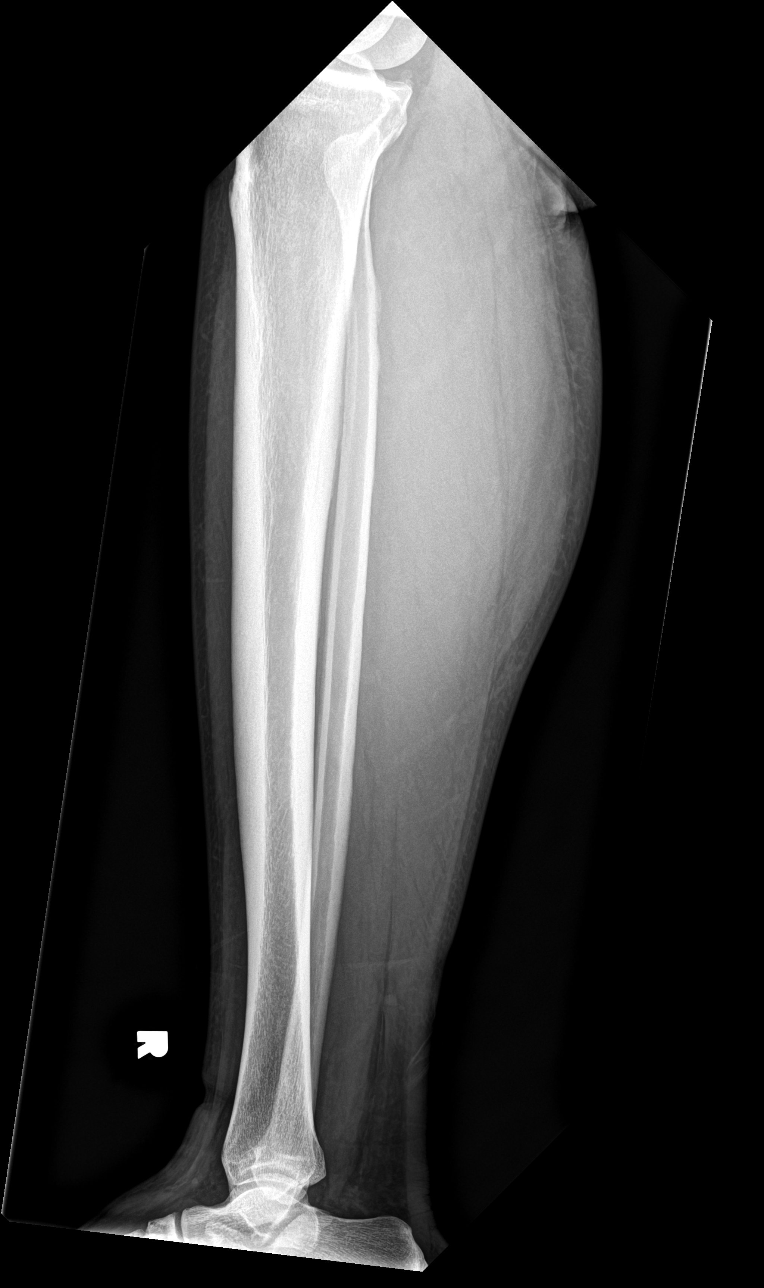
[im 4/4]
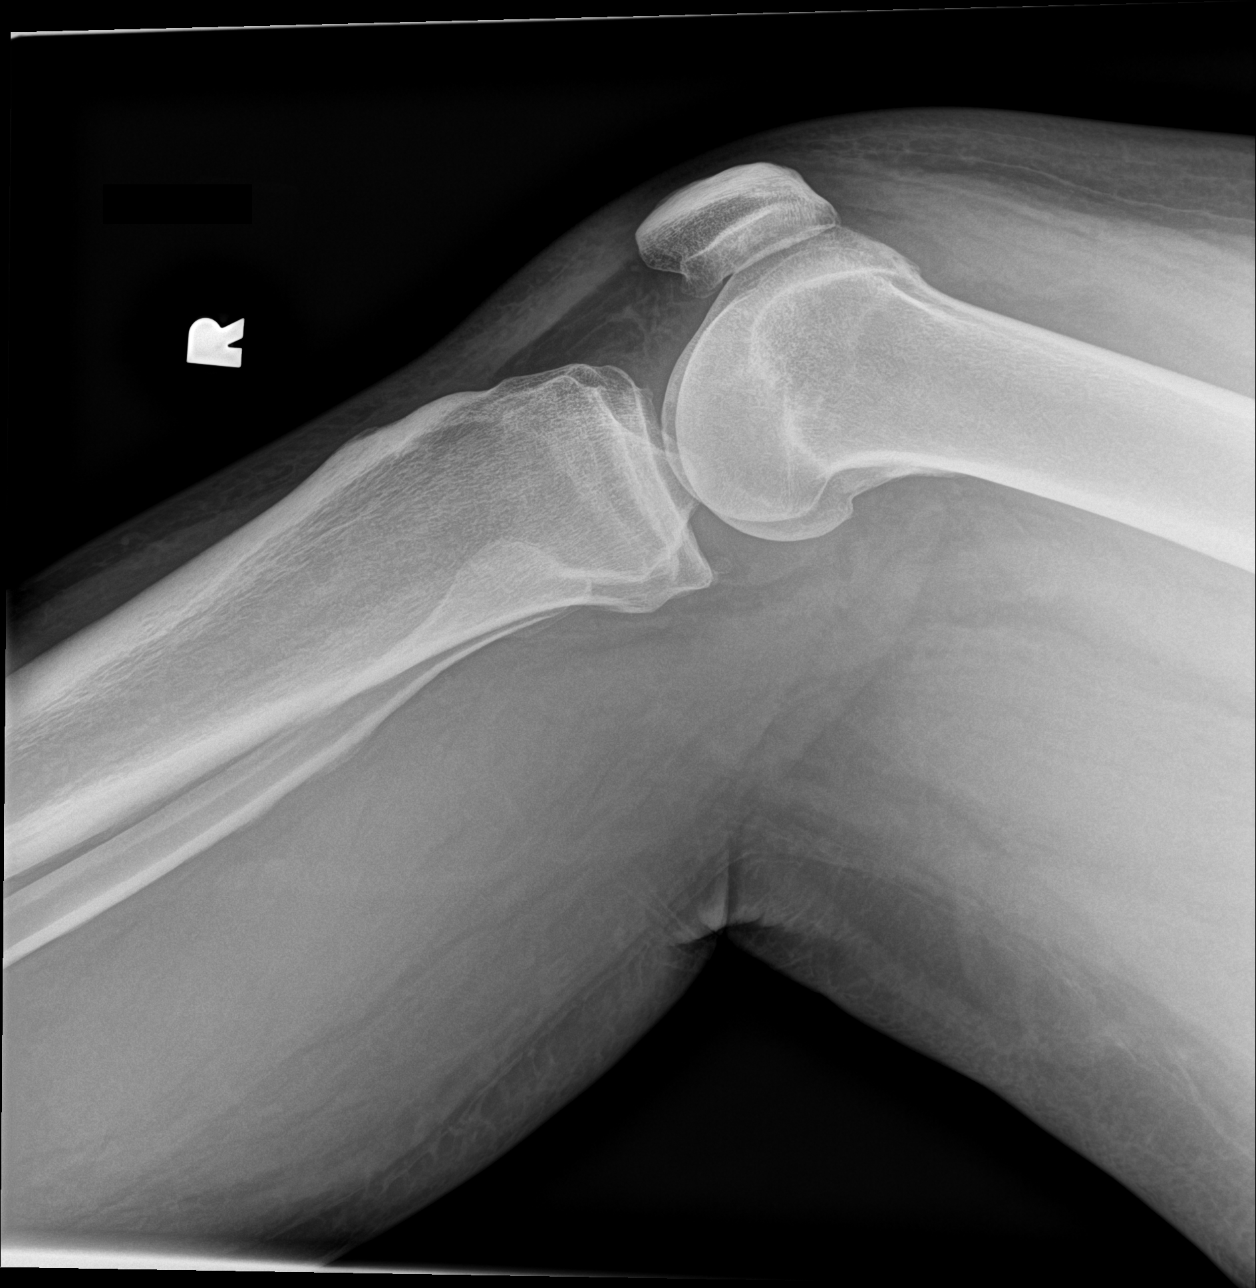

[4 of 4 positions shown; findings below may reference images not displayed]

FINDINGS: There is no evidence of fracture or other focal bone lesions. Soft
tissues are unremarkable.
IMPRESSION: Negative.

## 2023-09-12 ENCOUNTER — Ambulatory Visit: Admitting: Family Medicine
